# Patient Record
Sex: Male | Born: 1980 | Race: White | Hispanic: No | Marital: Married | Smoking: Never smoker
Health system: Southern US, Community
[De-identification: ages and names within clinical notes are randomized; demographics above are authoritative.]

## PROBLEM LIST (undated history)

## (undated) DIAGNOSIS — E785 Hyperlipidemia, unspecified: Secondary | ICD-10-CM

## (undated) DIAGNOSIS — E118 Type 2 diabetes mellitus with unspecified complications: Secondary | ICD-10-CM

## (undated) DIAGNOSIS — I471 Supraventricular tachycardia, unspecified: Secondary | ICD-10-CM

## (undated) DIAGNOSIS — E119 Type 2 diabetes mellitus without complications: Secondary | ICD-10-CM

## (undated) DIAGNOSIS — J189 Pneumonia, unspecified organism: Secondary | ICD-10-CM

## (undated) DIAGNOSIS — S92351A Displaced fracture of fifth metatarsal bone, right foot, initial encounter for closed fracture: Secondary | ICD-10-CM

## (undated) DIAGNOSIS — T7840XA Allergy, unspecified, initial encounter: Secondary | ICD-10-CM

## (undated) HISTORY — DX: Supraventricular tachycardia, unspecified: I47.10

## (undated) HISTORY — DX: Displaced fracture of fifth metatarsal bone, right foot, initial encounter for closed fracture: S92.351A

## (undated) HISTORY — DX: Pneumonia, unspecified organism: J18.9

## (undated) HISTORY — DX: Type 2 diabetes mellitus with unspecified complications: E11.8

## (undated) HISTORY — DX: Supraventricular tachycardia: I47.1

## (undated) HISTORY — DX: Allergy, unspecified, initial encounter: T78.40XA

## (undated) HISTORY — DX: Type 2 diabetes mellitus without complications: E11.9

## (undated) HISTORY — PX: TONSILECTOMY, ADENOIDECTOMY, BILATERAL MYRINGOTOMY AND TUBES: SHX2538

---

## 2014-04-05 ENCOUNTER — Encounter: Payer: Self-pay | Admitting: Physician Assistant

## 2015-01-07 ENCOUNTER — Emergency Department (HOSPITAL_COMMUNITY)
Admission: EM | Admit: 2015-01-07 | Discharge: 2015-01-07 | Disposition: A | Discharge status: Home / Self Care | Payer: BLUE CROSS/BLUE SHIELD | Attending: Emergency Medicine | Admitting: Emergency Medicine

## 2015-01-07 ENCOUNTER — Encounter (HOSPITAL_COMMUNITY): Payer: Self-pay | Admitting: Emergency Medicine

## 2015-01-07 DIAGNOSIS — R1013 Epigastric pain: Secondary | ICD-10-CM | POA: Diagnosis not present

## 2015-01-07 DIAGNOSIS — R509 Fever, unspecified: Secondary | ICD-10-CM | POA: Insufficient documentation

## 2015-01-07 DIAGNOSIS — E119 Type 2 diabetes mellitus without complications: Secondary | ICD-10-CM | POA: Diagnosis not present

## 2015-01-07 DIAGNOSIS — Z794 Long term (current) use of insulin: Secondary | ICD-10-CM | POA: Diagnosis not present

## 2015-01-07 DIAGNOSIS — Z79899 Other long term (current) drug therapy: Secondary | ICD-10-CM | POA: Diagnosis not present

## 2015-01-07 DIAGNOSIS — R197 Diarrhea, unspecified: Secondary | ICD-10-CM | POA: Insufficient documentation

## 2015-01-07 DIAGNOSIS — Z7951 Long term (current) use of inhaled steroids: Secondary | ICD-10-CM | POA: Insufficient documentation

## 2015-01-07 DIAGNOSIS — E785 Hyperlipidemia, unspecified: Secondary | ICD-10-CM | POA: Diagnosis not present

## 2015-01-07 DIAGNOSIS — R112 Nausea with vomiting, unspecified: Secondary | ICD-10-CM | POA: Insufficient documentation

## 2015-01-07 HISTORY — DX: Hyperlipidemia, unspecified: E78.5

## 2015-01-07 LAB — URINALYSIS, ROUTINE W REFLEX MICROSCOPIC
GLUCOSE, UA: 500 mg/dL — AB
Hgb urine dipstick: NEGATIVE
Ketones, ur: >80 mg/dL — AB
LEUKOCYTES UA: NEGATIVE
NITRITE: NEGATIVE
PH: 5 (ref 5.0–8.0)
Protein, ur: NEGATIVE mg/dL
SPECIFIC GRAVITY, URINE: 1.031 — AB (ref 1.005–1.030)
Urobilinogen, UA: 0.2 mg/dL (ref 0.0–1.0)

## 2015-01-07 LAB — COMPREHENSIVE METABOLIC PANEL
ALT: 20 U/L (ref 17–63)
AST: 23 U/L (ref 15–41)
Albumin: 4.3 g/dL (ref 3.5–5.0)
Alkaline Phosphatase: 55 U/L (ref 38–126)
Anion gap: 8 (ref 5–15)
BUN: 23 mg/dL — AB (ref 6–20)
CHLORIDE: 106 mmol/L (ref 101–111)
CO2: 25 mmol/L (ref 22–32)
Calcium: 9 mg/dL (ref 8.9–10.3)
Creatinine, Ser: 1.01 mg/dL (ref 0.61–1.24)
GFR calc Af Amer: >60 mL/min (ref >60)
Glucose, Bld: 214 mg/dL — ABNORMAL HIGH (ref 65–99)
POTASSIUM: 4.5 mmol/L (ref 3.5–5.1)
SODIUM: 139 mmol/L (ref 135–145)
Total Bilirubin: 1.1 mg/dL (ref 0.3–1.2)
Total Protein: 6.9 g/dL (ref 6.5–8.1)

## 2015-01-07 LAB — CBC
HEMATOCRIT: 61.9 % — AB (ref 39.0–52.0)
Hemoglobin: 17.6 g/dL — ABNORMAL HIGH (ref 13.0–17.0)
MCH: 33.3 pg (ref 26.0–34.0)
MCHC: 28.4 g/dL — ABNORMAL LOW (ref 30.0–36.0)
MCV: 117.2 fL — AB (ref 78.0–100.0)
Platelets: 214 10*3/uL (ref 150–400)
RBC: 5.28 MIL/uL (ref 4.22–5.81)
RDW: 14.7 % (ref 11.5–15.5)
WBC: 14.1 10*3/uL — AB (ref 4.0–10.5)

## 2015-01-07 LAB — LIPASE, BLOOD: LIPASE: 17 U/L — AB (ref 22–51)

## 2015-01-07 LAB — CBG MONITORING, ED
Glucose-Capillary: 185 mg/dL — ABNORMAL HIGH (ref 65–99)
Glucose-Capillary: 161 mg/dL — ABNORMAL HIGH (ref 65–99)

## 2015-01-07 MED ORDER — IBUPROFEN 800 MG PO TABS
800.0000 mg | ORAL_TABLET | Freq: Once | ORAL | Status: AC
  Administered 2015-01-07: 800 mg via ORAL
  Filled 2015-01-07: qty 1

## 2015-01-07 MED ORDER — ONDANSETRON 4 MG PO TBDP
4.0000 mg | ORAL_TABLET | Freq: Three times a day (TID) | ORAL | Status: DC | PRN
Start: 2015-01-07 — End: 2016-05-03

## 2015-01-07 MED ORDER — SODIUM CHLORIDE 0.9 % IV BOLUS (SEPSIS)
1000.0000 mL | Freq: Once | INTRAVENOUS | Status: AC
  Administered 2015-01-07: 1000 mL via INTRAVENOUS

## 2015-01-07 MED ORDER — ONDANSETRON HCL 4 MG/2ML IJ SOLN
4.0000 mg | Freq: Once | INTRAMUSCULAR | Status: AC
  Administered 2015-01-07: 4 mg via INTRAVENOUS
  Filled 2015-01-07: qty 2

## 2015-01-07 MED ORDER — LOPERAMIDE HCL 2 MG PO CAPS
2.0000 mg | ORAL_CAPSULE | Freq: Once | ORAL | Status: AC
  Administered 2015-01-07: 2 mg via ORAL
  Filled 2015-01-07: qty 1

## 2015-01-07 NOTE — Discharge Instructions (Signed)

## 2015-01-07 NOTE — ED Notes (Signed)
Patient with nausea and vomiting since 9pm last night.  Patient is a diabetic and blood sugar at home was 145.  Patient states that he has been having diarrhea with the nausea and vomiting.  Patient states that he has been having some ongoing stomach problems and pain in his abdomen.

## 2015-01-07 NOTE — ED Provider Notes (Signed)
TIME SEEN: 5:10 AM  CHIEF COMPLAINT: Nausea, vomiting and diarrhea  HPI: Pt is a 34 y.o. male with history of insulin-dependent type 2 diabetes, hyperlipidemia, SVT who presents emergency department with nausea, vomiting and diarrhea that started last night. Has a child at home with similar symptoms. Has had fever of 101 and chills. Reports he has had epigastric abdominal pain after 70 episodes of vomiting. No bloody stool or melena. No dysuria or hematuria. No history of abdominal surgery. No recent travel. No antibiotics use or hospitalization.    ROS: See HPI Constitutional: no fever  Eyes: no drainage  ENT: no runny nose   Cardiovascular:  no chest pain  Resp: no SOB  GI:  vomiting GU: no dysuria Integumentary: no rash  Allergy: no hives  Musculoskeletal: no leg swelling  Neurological: no slurred speech ROS otherwise negative  PAST MEDICAL HISTORY/PAST SURGICAL HISTORY:  Past Medical History  Diagnosis Date  . Allergy   . Diabetes mellitus without complication   . Supraventricular tachycardia   . Hyperlipidemia     MEDICATIONS:  Prior to Admission medications   Medication Sig Start Date End Date Taking? Authorizing Provider  atenolol (TENORMIN) 50 MG tablet Take 50 mg by mouth daily.   Yes Historical Provider, MD  atorvastatin (LIPITOR) 40 MG tablet Take 40 mg by mouth daily.   Yes Historical Provider, MD  cetirizine (ZYRTEC) 10 MG tablet Take 10 mg by mouth daily.   Yes Historical Provider, MD  fluticasone (FLONASE) 50 MCG/ACT nasal spray Place into both nostrils daily.   Yes Historical Provider, MD  insulin aspart (NOVOLOG) 100 UNIT/ML injection Inject 6 Units into the skin 3 (three) times daily before meals.   Yes Historical Provider, MD  Insulin Glargine (TOUJEO SOLOSTAR) 300 UNIT/ML SOPN Inject 70 Units into the skin daily.   Yes Historical Provider, MD  metFORMIN (GLUCOPHAGE) 1000 MG tablet Take 1,000 mg by mouth 2 (two) times daily with a meal.   Yes Historical  Provider, MD    ALLERGIES:  No Known Allergies  SOCIAL HISTORY:  Social History  Substance Use Topics  . Smoking status: Never Smoker   . Smokeless tobacco: Never Used  . Alcohol Use: 0.6 oz/week    1 Glasses of wine per week    FAMILY HISTORY: Family History  Problem Relation Age of Onset  . Hyperlipidemia Father   . Arthritis Maternal Aunt   . Diabetes Maternal Aunt   . Hyperlipidemia Maternal Aunt   . Cancer Maternal Grandmother   . Heart disease Maternal Grandmother   . Heart disease Paternal Grandfather     EXAM: BP 129/79 mmHg  Pulse 106  Temp(Src) 97.8 F (36.6 C) (Oral)  Resp 16  SpO2 99% CONSTITUTIONAL: Alert and oriented and responds appropriately to questions. Well-appearing; well-nourished HEAD: Normocephalic EYES: Conjunctivae clear, PERRL ENT: normal nose; no rhinorrhea; slightly dry mucous membranes; pharynx without lesions noted NECK: Supple, no meningismus, no LAD  CARD: Regular and tachycardic; S1 and S2 appreciated; no murmurs, no clicks, no rubs, no gallops RESP: Normal chest excursion without splinting or tachypnea; breath sounds clear and equal bilaterally; no wheezes, no rhonchi, no rales, no hypoxia or respiratory distress, speaking full sentences ABD/GI: Normal bowel sounds; non-distended; soft, non-tender, no rebound, no guarding, no peritoneal signs, negative Murphy sign, no tenderness at McBurney's point BACK:  The back appears normal and is non-tender to palpation, there is no CVA tenderness EXT: Normal ROM in all joints; non-tender to palpation; no edema; normal capillary refill;  no cyanosis, no calf tenderness or swelling    SKIN: Normal color for age and race; warm NEURO: Moves all extremities equally, sensation to light touch intact diffusely, cranial nerves II through XII intact PSYCH: The patient's mood and manner are appropriate. Grooming and personal hygiene are appropriate.  MEDICAL DECISION MAKING: Patient here nausea, vomiting  and diarrhea. Abdominal exam is benign. He is afebrile and nontoxic appearing. Suspect viral gastroenteritis especially given child at home with similar symptoms. I do not think he has cholecystitis, appendicitis. We'll treat symptomatically with Zofran. He is requesting ibuprofen for his pain. Labs show mild leukocytosis and hemoconcentration. Urine shows large ketones but no sign of infection. His blood glucose is mildly elevated at 214 but has a normal bicarbonate and normal anion gap. Suspect patient is dehydrated. We'll give IV fluids and reassess.  ED PROGRESS: Patient reports feeling better after 2 L of IV fluids. His heart rate has improved into the 90s. He is regular without difficulty. No vomiting. Abdominal exam is [place] benign. We'll discharge with prescription for Zofran. Have advised him to use Imodium as needed for diarrhea. Discussed return precautions. I do not feel he needs further emergent workup at this time. Discussed return precautions. He verbalized understanding and discomfort with this plan.     Layla Maw Ward, DO 01/07/15 [id number]

## 2015-09-29 ENCOUNTER — Emergency Department (HOSPITAL_COMMUNITY): Payer: BLUE CROSS/BLUE SHIELD

## 2015-09-29 ENCOUNTER — Emergency Department (HOSPITAL_COMMUNITY)
Admission: EM | Admit: 2015-09-29 | Discharge: 2015-09-29 | Disposition: A | Discharge status: Home / Self Care | Payer: BLUE CROSS/BLUE SHIELD | Attending: Emergency Medicine | Admitting: Emergency Medicine

## 2015-09-29 ENCOUNTER — Encounter (HOSPITAL_COMMUNITY): Payer: Self-pay

## 2015-09-29 DIAGNOSIS — Z794 Long term (current) use of insulin: Secondary | ICD-10-CM | POA: Diagnosis not present

## 2015-09-29 DIAGNOSIS — R000 Tachycardia, unspecified: Secondary | ICD-10-CM | POA: Diagnosis not present

## 2015-09-29 DIAGNOSIS — E785 Hyperlipidemia, unspecified: Secondary | ICD-10-CM | POA: Diagnosis not present

## 2015-09-29 DIAGNOSIS — R112 Nausea with vomiting, unspecified: Secondary | ICD-10-CM | POA: Insufficient documentation

## 2015-09-29 DIAGNOSIS — R1011 Right upper quadrant pain: Secondary | ICD-10-CM | POA: Insufficient documentation

## 2015-09-29 DIAGNOSIS — Z79899 Other long term (current) drug therapy: Secondary | ICD-10-CM | POA: Diagnosis not present

## 2015-09-29 DIAGNOSIS — Z7984 Long term (current) use of oral hypoglycemic drugs: Secondary | ICD-10-CM | POA: Diagnosis not present

## 2015-09-29 DIAGNOSIS — E119 Type 2 diabetes mellitus without complications: Secondary | ICD-10-CM | POA: Insufficient documentation

## 2015-09-29 DIAGNOSIS — R197 Diarrhea, unspecified: Secondary | ICD-10-CM | POA: Diagnosis not present

## 2015-09-29 DIAGNOSIS — R109 Unspecified abdominal pain: Secondary | ICD-10-CM | POA: Diagnosis present

## 2015-09-29 DIAGNOSIS — R1013 Epigastric pain: Secondary | ICD-10-CM | POA: Diagnosis not present

## 2015-09-29 DIAGNOSIS — R1010 Upper abdominal pain, unspecified: Secondary | ICD-10-CM

## 2015-09-29 DIAGNOSIS — Z7951 Long term (current) use of inhaled steroids: Secondary | ICD-10-CM | POA: Diagnosis not present

## 2015-09-29 LAB — COMPREHENSIVE METABOLIC PANEL
ALK PHOS: 61 U/L (ref 38–126)
ALT: 23 U/L (ref 17–63)
ANION GAP: 9 (ref 5–15)
AST: 22 U/L (ref 15–41)
Albumin: 4.1 g/dL (ref 3.5–5.0)
BUN: 20 mg/dL (ref 6–20)
CHLORIDE: 105 mmol/L (ref 101–111)
CO2: 22 mmol/L (ref 22–32)
Calcium: 9 mg/dL (ref 8.9–10.3)
Creatinine, Ser: 0.87 mg/dL (ref 0.61–1.24)
GFR calc non Af Amer: >60 mL/min (ref >60)
Glucose, Bld: 148 mg/dL — ABNORMAL HIGH (ref 65–99)
Potassium: 3.7 mmol/L (ref 3.5–5.1)
SODIUM: 136 mmol/L (ref 135–145)
Total Bilirubin: 1 mg/dL (ref 0.3–1.2)
Total Protein: 6.8 g/dL (ref 6.5–8.1)

## 2015-09-29 LAB — URINALYSIS, ROUTINE W REFLEX MICROSCOPIC
Glucose, UA: 100 mg/dL — AB
Hgb urine dipstick: NEGATIVE
LEUKOCYTES UA: NEGATIVE
NITRITE: NEGATIVE
PH: 5.5 (ref 5.0–8.0)
PROTEIN: NEGATIVE mg/dL
Specific Gravity, Urine: 1.036 — ABNORMAL HIGH (ref 1.005–1.030)

## 2015-09-29 LAB — CBC
HCT: 48.5 % (ref 39.0–52.0)
HEMOGLOBIN: 16.7 g/dL (ref 13.0–17.0)
MCH: 30.7 pg (ref 26.0–34.0)
MCHC: 34.4 g/dL (ref 30.0–36.0)
MCV: 89.2 fL (ref 78.0–100.0)
PLATELETS: 208 10*3/uL (ref 150–400)
RBC: 5.44 MIL/uL (ref 4.22–5.81)
RDW: 12.9 % (ref 11.5–15.5)
WBC: 6.8 10*3/uL (ref 4.0–10.5)

## 2015-09-29 LAB — CBG MONITORING, ED: Glucose-Capillary: 146 mg/dL — ABNORMAL HIGH (ref 65–99)

## 2015-09-29 LAB — LIPASE, BLOOD: LIPASE: 21 U/L (ref 11–51)

## 2015-09-29 MED ORDER — ONDANSETRON 4 MG PO TBDP
4.0000 mg | ORAL_TABLET | Freq: Once | ORAL | Status: AC | PRN
  Administered 2015-09-29: 4 mg via ORAL

## 2015-09-29 MED ORDER — ONDANSETRON 4 MG PO TBDP
ORAL_TABLET | ORAL | Status: AC
  Filled 2015-09-29: qty 1

## 2015-09-29 MED ORDER — DIPHENOXYLATE-ATROPINE 2.5-0.025 MG PO TABS: 2.0000 | ORAL_TABLET | Freq: Four times a day (QID) | ORAL | Status: AC | PRN

## 2015-09-29 MED ORDER — ONDANSETRON HCL 4 MG PO TABS: 4.0000 mg | ORAL_TABLET | Freq: Four times a day (QID) | ORAL | Status: DC

## 2015-09-29 MED ORDER — SODIUM CHLORIDE 0.9 % IV BOLUS (SEPSIS)
1000.0000 mL | Freq: Once | INTRAVENOUS | Status: AC
  Administered 2015-09-29: 1000 mL via INTRAVENOUS

## 2015-09-29 MED ORDER — HYDROMORPHONE HCL 1 MG/ML IJ SOLN
1.0000 mg | Freq: Once | INTRAMUSCULAR | Status: AC
  Administered 2015-09-29: 1 mg via INTRAVENOUS
  Filled 2015-09-29: qty 1

## 2015-09-29 MED ORDER — METOCLOPRAMIDE HCL 5 MG/ML IJ SOLN
10.0000 mg | Freq: Once | INTRAMUSCULAR | Status: AC
  Administered 2015-09-29: 10 mg via INTRAVENOUS
  Filled 2015-09-29: qty 2

## 2015-09-29 NOTE — ED Notes (Signed)
Patient transported to Ultrasound 

## 2015-09-29 NOTE — Discharge Instructions (Signed)

## 2015-09-29 NOTE — ED Notes (Signed)
Pt comes to tech first and states the medication he recv in triage has not helped.

## 2015-09-29 NOTE — ED Notes (Signed)
Pt seen by urgent care this afternoon. Told to return if pain did not resolve/worsened. Pt here complaining of upper right abdominal pain. Pt complaining of nausea, vomiting and diarrhea since yesterday afternoon.

## 2015-09-29 NOTE — ED Provider Notes (Signed)
CSN: [id number]     Arrival date & time 09/29/15  0105 History  By signing my name below, I, Bethel BornBritney McCollum, attest that this documentation has been prepared under the direction and in the presence of Gilda Creasehristopher J Sharona Rovner, MD. Electronically Signed: Bethel BornBritney McCollum, ED Scribe. 09/29/2015. 2:51 AM    Chief Complaint  Patient presents with  . Abdominal Pain  . Nausea  . Emesis  . Diarrhea   The history is provided by the patient. No language interpreter was used.   Javier Mccoy is a 35 y.o. male with PMHx including DM who presents to the Emergency Department complaining of constant, 4/10 in severity, upper abdominal pain with onset yesterday. The pain radiates to the right side of the abdomen. He has had similar pain in the past related to his gallbladder but those episodes were less persistent. He states that typically his gallbladder is affected by his diabetes medication and the pain resolves once he discontinues the medication.  Associated symptoms include nausea , vomiting, and diarrhea.He was seen at Urgent Care for his symptoms last afternoon and advised to return if his symptoms persisted/worsened. The nausea medication that he was given at Urgent Care provided insufficient relief.   Past Medical History  Diagnosis Date  . Allergy   . Diabetes mellitus without complication (HCC)   . Supraventricular tachycardia (HCC)   . Hyperlipidemia    Past Surgical History  Procedure Laterality Date  . Tonsilectomy, adenoidectomy, bilateral myringotomy and tubes     Family History  Problem Relation Age of Onset  . Hyperlipidemia Father   . Arthritis Maternal Aunt   . Diabetes Maternal Aunt   . Hyperlipidemia Maternal Aunt   . Cancer Maternal Grandmother   . Heart disease Maternal Grandmother   . Heart disease Paternal Grandfather    Social History  Substance Use Topics  . Smoking status: Never Smoker   . Smokeless tobacco: Never Used  . Alcohol Use: 0.6 oz/week    1 Glasses of  wine per week    Review of Systems  Gastrointestinal: Positive for nausea, vomiting, abdominal pain and diarrhea.  All other systems reviewed and are negative.  Allergies  Review of patient's allergies indicates no known allergies.  Home Medications   Prior to Admission medications   Medication Sig Start Date End Date Taking? Authorizing Provider  atenolol (TENORMIN) 50 MG tablet Take 50 mg by mouth daily.    Historical Provider, MD  atorvastatin (LIPITOR) 40 MG tablet Take 40 mg by mouth daily.    Historical Provider, MD  cetirizine (ZYRTEC) 10 MG tablet Take 10 mg by mouth daily.    Historical Provider, MD  fluticasone (FLONASE) 50 MCG/ACT nasal spray Place into both nostrils daily.    Historical Provider, MD  insulin aspart (NOVOLOG) 100 UNIT/ML injection Inject 6 Units into the skin 3 (three) times daily before meals.    Historical Provider, MD  Insulin Glargine (TOUJEO SOLOSTAR) 300 UNIT/ML SOPN Inject 70 Units into the skin daily.    Historical Provider, MD  metFORMIN (GLUCOPHAGE) 1000 MG tablet Take 1,000 mg by mouth 2 (two) times daily with a meal.    Historical Provider, MD  ondansetron (ZOFRAN ODT) 4 MG disintegrating tablet Take 1 tablet (4 mg total) by mouth every 8 (eight) hours as needed for nausea or vomiting. 01/07/15   Kristen N Ward, DO   BP 142/92 mmHg  Pulse 120  Temp(Src) 97.4 F (36.3 C) (Oral)  Resp 16  Ht 6' (1.829 m)  Wt 197 lb (89.359 kg)  BMI 26.71 kg/m2  SpO2 97% Physical Exam  Constitutional: He is oriented to person, place, and time. He appears well-developed and well-nourished. No distress.  HENT:  Head: Normocephalic and atraumatic.  Right Ear: Hearing normal.  Left Ear: Hearing normal.  Nose: Nose normal.  Mouth/Throat: Oropharynx is clear and moist and mucous membranes are normal.  Eyes: Conjunctivae and EOM are normal. Pupils are equal, round, and reactive to light.  Neck: Normal range of motion. Neck supple.  Cardiovascular: Regular rhythm,  S1 normal and S2 normal.  Exam reveals no gallop and no friction rub.   No murmur heard. Borderline tachycardia   Pulmonary/Chest: Effort normal and breath sounds normal. No respiratory distress. He exhibits no tenderness.  Abdominal: Soft. Normal appearance and bowel sounds are normal. There is no hepatosplenomegaly. There is no tenderness. There is no rebound, no guarding, no tenderness at McBurney's point and negative Murphy's sign. No hernia.  Epigastric and RUQ TTP No guarding No rebound Negative Murphy's sign   Musculoskeletal: Normal range of motion.  Neurological: He is alert and oriented to person, place, and time. He has normal strength. No cranial nerve deficit or sensory deficit. Coordination normal. GCS eye subscore is 4. GCS verbal subscore is 5. GCS motor subscore is 6.  Skin: Skin is warm, dry and intact. No rash noted. No cyanosis.  Psychiatric: He has a normal mood and affect. His speech is normal and behavior is normal. Thought content normal.  Nursing note and vitals reviewed.   ED Course  Procedures (including critical care time) DIAGNOSTIC STUDIES: Oxygen Saturation is 97% on RA,  normal by my interpretation.    COORDINATION OF CARE: 2:48 AM Discussed treatment plan which includes lab work, RUQ [place], IVF, antiemetic medication, and pain medication with pt at bedside and pt agreed to plan.  Labs Review Labs Reviewed  COMPREHENSIVE METABOLIC PANEL - Abnormal; Notable for the following:    Glucose, Bld 148 (*)    All other components within normal limits  URINALYSIS, ROUTINE W REFLEX MICROSCOPIC (NOT AT [facility]) - Abnormal; Notable for the following:    Color, Urine AMBER (*)    Specific Gravity, Urine 1.036 (*)    Glucose, UA 100 (*)    Bilirubin Urine SMALL (*)    Ketones, ur >80 (*)    All other components within normal limits  CBG MONITORING, ED - Abnormal; Notable for the following:    Glucose-Capillary 146 (*)    All other components within normal limits   LIPASE, BLOOD  CBC    Imaging Review US Abdomen Limited Ruq  09/29/2015  CLINICAL DATA:  Initial evaluation for acute right upper quadrant pain since yesterday, worsening. EXAM: US ABDOMEN LIMITED - RIGHT UPPER QUADRANT COMPARISON:  None. FINDINGS: Gallbladder: No gallstones or wall thickening visualized. No sonographic Murphy sign noted by sonographer. Common bile duct: Diameter: 3 mm Liver: No focal lesion identified. Within normal limits in parenchymal echogenicity. Mild lobulation of the hepatic contour noted. IMPRESSION: Normal right upper quadrant ultrasound. No sonographic evidence for cholelithiasis, acute cholecystitis, or biliary dilatation. Electronically Signed   By: Rise Mu M.D.   On: 09/29/2015 03:41   I have personally reviewed and evaluated these images and lab results as part of my medical decision-making.   EKG Interpretation None      MDM   Final diagnoses:  Pain of upper abdomen  Nausea and vomiting, vomiting of unspecified type  Diarrhea, unspecified type    Patient  refers to the emergency department from urgent care secondary to abdominal pain with nausea, vomiting and diarrhea. Patient reports that he has had similar episodes in the past. He was primarily complaining of pain in the epigastric area but also on the right upper abdomen. Lab work was unremarkable. No sign of urinary tract infection. Glucose was 148, no sign of DKA. CBC and lipase were normal. Patient underwent a bladder ultrasound to further evaluate right upper quadrant pain. No abnormality was seen.Procedure: Intubation Permit was implied secondary to emergent situation. A MAC 4 blade was inserted into the oropharynx at which time the vocal cords were visualized. A 7.5-French endotracheal tube was inserted and visualized going through the vocal cords. The stylette was removed. Colorimetric change was visualized on the CO2 meter. Breath sounds were heard in both lung fields equally. The  endotracheal tube was placed at 22 cm, measured at the teeth. Appear to be mildly dehydrated at arrival. This was treated with IV fluids and symptomatic to him for pain, nausea and vomiting. Patient has improved and will be discharged.  I personally performed the services described in this documentation, which was scribed in my presence. The recorded information has been reviewed and is accurate.    Gilda Crease, MD 09/29/15 [phone]

## 2016-04-27 LAB — BASIC METABOLIC PANEL
BUN: 18 mg/dL (ref 4–21)
CREATININE: 0.9 mg/dL (ref 0.6–1.3)
Glucose: 141 mg/dL
Potassium: 4.7 mmol/L (ref 3.4–5.3)
Sodium: 139 mmol/L (ref 137–147)

## 2016-04-27 LAB — LIPID PANEL
CHOLESTEROL: 158 mg/dL (ref 0–200)
HDL: 42 mg/dL (ref 35–70)
LDL CALC: 81 mg/dL
TRIGLYCERIDES: 175 mg/dL — AB (ref 40–160)

## 2016-04-27 LAB — HEMOGLOBIN A1C: HEMOGLOBIN A1C: 6.8

## 2016-04-27 LAB — HEPATIC FUNCTION PANEL
ALT: 23 U/L (ref 10–40)
AST: 16 U/L (ref 14–40)
Alkaline Phosphatase: 66 U/L (ref 25–125)

## 2016-05-03 ENCOUNTER — Ambulatory Visit (INDEPENDENT_AMBULATORY_CARE_PROVIDER_SITE_OTHER): Payer: BC Managed Care – PPO | Admitting: Family

## 2016-05-03 ENCOUNTER — Encounter: Payer: Self-pay | Admitting: Family

## 2016-05-03 VITALS — BP 110/74 | HR 67 | Temp 98.1°F | Resp 16 | Ht 72.0 in | Wt 207.0 lb

## 2016-05-03 DIAGNOSIS — E785 Hyperlipidemia, unspecified: Secondary | ICD-10-CM | POA: Insufficient documentation

## 2016-05-03 DIAGNOSIS — E782 Mixed hyperlipidemia: Secondary | ICD-10-CM

## 2016-05-03 DIAGNOSIS — Z794 Long term (current) use of insulin: Secondary | ICD-10-CM

## 2016-05-03 DIAGNOSIS — I471 Supraventricular tachycardia, unspecified: Secondary | ICD-10-CM | POA: Insufficient documentation

## 2016-05-03 DIAGNOSIS — E119 Type 2 diabetes mellitus without complications: Secondary | ICD-10-CM | POA: Diagnosis not present

## 2016-05-03 DIAGNOSIS — Z23 Encounter for immunization: Secondary | ICD-10-CM | POA: Diagnosis not present

## 2016-05-03 DIAGNOSIS — E1169 Type 2 diabetes mellitus with other specified complication: Secondary | ICD-10-CM | POA: Insufficient documentation

## 2016-05-03 NOTE — Progress Notes (Signed)
Subjective:    Patient ID: Javier Mccoy, male    DOB: [birth year], 36 y.o.   MRN: [id number]  Chief Complaint  Patient presents with  . Establish Care    Diabetes check up    HPI:  Javier Mccoy is a 36 y.o. male who  has a past medical history of Allergy; Diabetes mellitus without complication (HCC); Hyperlipidemia; and Supraventricular tachycardia (HCC). and presents today for an office visit to establish care.  1.) Type 2 diabetes - currently maintained on NovoLog, metformin, and Toujeo. Reports taking the medication as prescribed and denies adverse side effects and no hypoglycemic readings. Home blood sugars with the highest of 180 and averaging 110-130 . Most recent A1c of 6.8. Last diabetic eye exam completed within the last 12 months and believes it is due in March. No changes in vision or new symptoms of end organ damage.   2.) SVT - currently maintained on atenolol. Reports taking medication as prescribed and notes following exercise he does get some dizziness on occasion when recovering which resolves fairly quickly.  Denies any heart palpitations or episodes of SVT exacerbations.  3.) Hyperlipidemia - currently maintained on atorvastatin. Reports taking the medication as prescribed and denies adverse side effects or myalgias.Has had previous failed treatments with Crestor secondary to abdominal symptoms.    No Known Allergies    Outpatient Medications Prior to Visit  Medication Sig Dispense Refill  . atenolol (TENORMIN) 50 MG tablet Take 50 mg by mouth daily.    Marland Kitchen atorvastatin (LIPITOR) 40 MG tablet Take 40 mg by mouth daily.    . cetirizine (ZYRTEC) 10 MG tablet Take 10 mg by mouth daily.    . diphenoxylate-atropine (LOMOTIL) 2.5-0.025 MG tablet Take 2 tablets by mouth 4 (four) times daily as needed for diarrhea or loose stools. 30 tablet 0  . insulin aspart (NOVOLOG) 100 UNIT/ML injection Inject 6-17 Units into the skin 3 (three) times daily before meals.     .  Insulin Glargine (TOUJEO SOLOSTAR) 300 UNIT/ML SOPN Inject 70 Units into the skin daily.    . metFORMIN (GLUCOPHAGE) 1000 MG tablet Take 1,000 mg by mouth 2 (two) times daily with a meal.    . ondansetron (ZOFRAN) 4 MG tablet Take 1 tablet (4 mg total) by mouth every 6 (six) hours. 20 tablet 0  . fluticasone (FLONASE) 50 MCG/ACT nasal spray Place into both nostrils daily.    . ondansetron (ZOFRAN ODT) 4 MG disintegrating tablet Take 1 tablet (4 mg total) by mouth every 8 (eight) hours as needed for nausea or vomiting. 20 tablet 0   No facility-administered medications prior to visit.      Past Medical History:  Diagnosis Date  . Allergy   . Diabetes mellitus without complication (HCC)   . Hyperlipidemia   . Supraventricular tachycardia [facility])       Past Surgical History:  Procedure Laterality Date  . TONSILECTOMY, ADENOIDECTOMY, BILATERAL MYRINGOTOMY AND TUBES        Family History  Problem Relation Age of Onset  . Thyroid disease Mother   . Hyperlipidemia Father   . Cancer Maternal Grandmother     Breast and lung  . Heart disease Maternal Grandmother   . Healthy Maternal Grandfather   . Healthy Paternal Grandmother   . Heart disease Paternal Grandfather   . Arthritis Maternal Aunt   . Diabetes Maternal Aunt   . Hyperlipidemia Maternal Aunt       Social History   Social History  .  Marital status: Married    Spouse name: N/A  . Number of children: 2  . Years of education: 42   Occupational History  . Administrator     Social History Main Topics  . Smoking status: Never Smoker  . Smokeless tobacco: Never Used  . Alcohol use 0.6 oz/week    1 Glasses of wine per week  . Drug use: No  . Sexual activity: Yes   Other Topics Concern  . Not on file   Social History Narrative   Fun: Cycle, run and kayak.       Review of Systems  Eyes:       Negative for changes in vision.  Respiratory: Negative for chest tightness and shortness of breath.     Cardiovascular: Negative for chest pain, palpitations and leg swelling.  Endocrine: Negative for polydipsia, polyphagia and polyuria.  Neurological: Negative for dizziness, weakness, light-headedness and headaches.       Objective:    BP 110/74 (BP Location: Left Arm, Patient Position: Sitting, Cuff Size: Large)   Pulse 67   Temp 98.1 F (36.7 C) (Oral)   Resp 16   Ht 6' (1.829 m)   Wt 207 lb (93.9 kg)   SpO2 97%   BMI 28.07 kg/m  Nursing note and vital signs reviewed.  Physical Exam  Constitutional: He is oriented to person, place, and time. He appears well-developed and well-nourished. No distress.  Cardiovascular: Normal rate, regular rhythm, normal heart sounds and intact distal pulses.   Pulmonary/Chest: Effort normal and breath sounds normal.  Neurological: He is alert and oriented to person, place, and time.  Diabetic foot exam - bilateral feet are free from skin breakdown, cuts, and abrasions. Toenails are neatly trimmed. Pulses are intact and appropriate. Sensation is intact to monofilament bilaterally.  Skin: Skin is warm and dry.  Psychiatric: He has a normal mood and affect. His behavior is normal. Judgment and thought content normal.        Assessment & Plan:   Problem List Items Addressed This Visit      Cardiovascular and Mediastinum   SVT (supraventricular tachycardia) (HCC)    Supraventricular tachycardia stable and maintained on atenolol with no episodes recently. Denies chest pain, shortness of breath, or heart palpitations with normal cardiac exam today. Continue current dosage of atenolol. Continue to monitor.        Endocrine   Type 2 diabetes mellitus (HCC) - Primary    Most recent A1c appears stable at 6.8 with current regimen and no adverse side effects or hypoglycemic readings. Pneumovax updated today. Diabetic foot exam completed. Diabetic eye exam is up-to-date. Maintained on atorvastatin for CAD risk reduction. Continue current dosage of  metformin, NovoLog, and Toujeo. May require switch from long-acting insulin secondary to insurance coverage. Has had previous treatment failures with GLP-1 agonist secondary to adverse side effects. Continue to monitor.        Other   Mixed hyperlipidemia    Mixed hyperlipidemia currently maintained on atorvastatin with no adverse side effects or myalgias. Most recent lipid profile appears stable. Continue current dosage of atorvastatin and continue to monitor.       Other Visit Diagnoses    Need for 23-polyvalent pneumococcal polysaccharide vaccine       Relevant Orders   Pneumococcal polysaccharide vaccine 23-valent greater than or equal to 2yo subcutaneous/IM (Completed)       I have discontinued Mr. Kerney fluticasone. I am also having him maintain his cetirizine, metFORMIN, atenolol, atorvastatin, Insulin  Glargine, insulin aspart, ondansetron, and diphenoxylate-atropine.   Follow-up: Return in about 3 months (around 08/01/2016), or if symptoms worsen or fail to improve.   Jeanine Luzalone, Gregory, FNP

## 2016-05-03 NOTE — Assessment & Plan Note (Signed)
Most recent A1c appears stable at 6.8 with current regimen and no adverse side effects or hypoglycemic readings. Pneumovax updated today. Diabetic foot exam completed. Diabetic eye exam is up-to-date. Maintained on atorvastatin for CAD risk reduction. Continue current dosage of metformin, NovoLog, and Toujeo. May require switch from long-acting insulin secondary to insurance coverage. Has had previous treatment failures with GLP-1 agonist secondary to adverse side effects. Continue to monitor.

## 2016-05-03 NOTE — Assessment & Plan Note (Signed)
Supraventricular tachycardia stable and maintained on atenolol with no episodes recently. Denies chest pain, shortness of breath, or heart palpitations with normal cardiac exam today. Continue current dosage of atenolol. Continue to monitor.

## 2016-05-03 NOTE — Assessment & Plan Note (Signed)
Mixed hyperlipidemia currently maintained on atorvastatin with no adverse side effects or myalgias. Most recent lipid profile appears stable. Continue current dosage of atorvastatin and continue to monitor.

## 2016-05-03 NOTE — Patient Instructions (Addendum)
Thank you for choosing [facility].  SUMMARY AND INSTRUCTIONS:  Medication:  Please continue to take your medications as prescribed.   Your prescription(s) have been submitted to your pharmacy or been printed and provided for you. Please take as directed and contact our office if you believe you are having problem(s) with the medication(s) or have any questions.  Follow up:  If your symptoms worsen or fail to improve, please contact our office for further instruction, or in case of emergency go directly to the emergency room at the closest medical facility.     

## 2016-05-30 ENCOUNTER — Encounter: Payer: Self-pay | Admitting: Family

## 2016-05-31 ENCOUNTER — Other Ambulatory Visit: Payer: Self-pay

## 2016-05-31 MED ORDER — ONETOUCH ULTRA SYSTEM W/DEVICE KIT: 1.0000 | PACK | Freq: Once | 0 refills | Status: AC

## 2016-06-23 ENCOUNTER — Other Ambulatory Visit: Payer: Self-pay

## 2016-06-23 MED ORDER — ATORVASTATIN CALCIUM 40 MG PO TABS: 40.0000 mg | ORAL_TABLET | Freq: Every day | ORAL | 1 refills | Status: DC

## 2016-06-23 MED ORDER — GLUCOSE BLOOD VI STRP: ORAL_STRIP | 12 refills | Status: DC

## 2016-07-07 ENCOUNTER — Encounter: Payer: Self-pay | Admitting: Physician Assistant

## 2016-07-19 ENCOUNTER — Encounter: Payer: Self-pay | Admitting: Family

## 2016-07-19 ENCOUNTER — Other Ambulatory Visit: Payer: Self-pay

## 2016-07-19 MED ORDER — PEN NEEDLES 31G X 5 MM MISC: 1.0000 "pen " | Freq: Three times a day (TID) | 3 refills | Status: DC

## 2016-08-05 ENCOUNTER — Telehealth: Payer: Self-pay | Admitting: Physician Assistant

## 2016-08-05 NOTE — Telephone Encounter (Signed)
Ok with me 

## 2016-08-05 NOTE — Telephone Encounter (Signed)
Ok by me if ok by current PCP 

## 2016-08-05 NOTE — Telephone Encounter (Signed)
Pt called wanted to switch from dr Carver Fila to dr g.  He stated he is closer to [place] location.  Is it ok to schedule??

## 2016-08-09 NOTE — Telephone Encounter (Signed)
Appointment 6/6 Pt aware

## 2016-09-14 ENCOUNTER — Encounter: Payer: Self-pay | Admitting: Family

## 2016-09-15 ENCOUNTER — Other Ambulatory Visit: Payer: Self-pay

## 2016-09-15 MED ORDER — ATENOLOL 50 MG PO TABS: 50.0000 mg | ORAL_TABLET | Freq: Every day | ORAL | 0 refills | Status: DC

## 2016-09-29 ENCOUNTER — Encounter: Payer: Self-pay | Admitting: Family Medicine

## 2016-09-29 ENCOUNTER — Ambulatory Visit (INDEPENDENT_AMBULATORY_CARE_PROVIDER_SITE_OTHER): Payer: BC Managed Care – PPO | Admitting: Family Medicine

## 2016-09-29 VITALS — BP 114/70 | HR 67 | Temp 97.4°F | Ht 72.0 in | Wt 205.8 lb

## 2016-09-29 DIAGNOSIS — Z794 Long term (current) use of insulin: Secondary | ICD-10-CM | POA: Diagnosis not present

## 2016-09-29 DIAGNOSIS — E782 Mixed hyperlipidemia: Secondary | ICD-10-CM

## 2016-09-29 DIAGNOSIS — M6788 Other specified disorders of synovium and tendon, other site: Secondary | ICD-10-CM | POA: Insufficient documentation

## 2016-09-29 DIAGNOSIS — M67972 Unspecified disorder of synovium and tendon, left ankle and foot: Secondary | ICD-10-CM

## 2016-09-29 DIAGNOSIS — I471 Supraventricular tachycardia: Secondary | ICD-10-CM | POA: Diagnosis not present

## 2016-09-29 DIAGNOSIS — E119 Type 2 diabetes mellitus without complications: Secondary | ICD-10-CM | POA: Diagnosis not present

## 2016-09-29 LAB — BASIC METABOLIC PANEL
BUN: 20 mg/dL (ref 6–23)
CHLORIDE: 105 meq/L (ref 96–112)
CO2: 26 meq/L (ref 19–32)
CREATININE: 0.85 mg/dL (ref 0.40–1.50)
Calcium: 9.3 mg/dL (ref 8.4–10.5)
GFR: 107.99 mL/min (ref >60.00)
Glucose, Bld: 112 mg/dL — ABNORMAL HIGH (ref 70–99)
POTASSIUM: 4.1 meq/L (ref 3.5–5.1)
Sodium: 138 mEq/L (ref 135–145)

## 2016-09-29 LAB — MICROALBUMIN / CREATININE URINE RATIO
Creatinine,U: 230.9 mg/dL
MICROALB UR: 1.4 mg/dL (ref 0.0–1.9)
MICROALB/CREAT RATIO: 0.6 mg/g (ref 0.0–30.0)

## 2016-09-29 LAB — HEMOGLOBIN A1C: HEMOGLOBIN A1C: 6.5 % (ref 4.6–6.5)

## 2016-09-29 NOTE — Patient Instructions (Addendum)
Labs today Schedule eye exam as you're due. Schedule follow up with Dr Patsy Lager[place] for evaluation of left achilles tendon.  Let me know when running low on toujeo (to switch to levemir).  Return in 6 months for physical.

## 2016-09-29 NOTE — Assessment & Plan Note (Signed)
Chronic, stable. Continue current regimen. Pt interested in switch back to levemir (better insurance coverage). Pt will let us know when running low on current toujeo.  Update labs today.  rec schedule eye exam.

## 2016-09-29 NOTE — Progress Notes (Signed)
BP 114/70   Pulse 67   Temp 97.4 F (36.3 C) (Oral)   Ht 6' (1.829 m)   Wt 205 lb 12.8 oz (93.4 kg)   SpO2 96%   BMI 27.91 kg/m    CC: transfer care visit Subjective:    Patient ID: Javier Mccoy, male    DOB: 1980/11/18, 36 y.o.   MRN: [id number]  HPI: Javier Mccoy is a 36 y.o. male presenting on 09/29/2016 for Establish Care   Longstanding chronic achilles tendonitis. Prior injury 2.5 yrs ago.   SVT since 2003 - controlled with atenolol 50mg  daily  HLD - compliant with atorvastatin 40mg  daily without myalgias. Endorses occasional L achilles inflammation.   DM - dx 2008. Regularly does check sugars BID or more, they tend to run <200. Compliant with antihyperglycemic regimen which includes: novolog mealtime 6-17 units (6u with meals + 3-4 units every 30 above 150), toujeo 70u nightly, metformin 1000mg  bid. Has gone through CGM. (levemir 40u -> toujeo 70u changed for better glycemic control, interested in return to levemir). Few low sugars, with hypoglycemia. Denies paresthesias. Last diabetic eye exam DUE. Pneumovax: 04/2016. Prevnar: not due. Foot exam done 04/2016. Previous treatment failure with GLP-1 agonist due to adverse side effect.  Lab Results  Component Value Date   HGBA1C 6.8 04/27/2016   Diabetic Foot Exam - Simple   Simple Foot Form Diabetic Foot exam was performed with the following findings:  Yes 09/29/2016  8:15 AM  Visual Inspection No deformities, no ulcerations, no other skin breakdown bilaterally:  Yes Sensation Testing Intact to touch and monofilament testing bilaterally:  Yes Pulse Check Posterior Tibialis and Dorsalis pulse intact bilaterally:  Yes Comments      Lives with husband [name]) and 2 children Occ: college Automotive engineer Activity: cycle, run and kayak.  Diet: good water, fruits/vegetables daily  Relevant past medical, surgical, family and social history reviewed and updated as indicated. Interim medical history since our last  visit reviewed. Allergies and medications reviewed and updated. Outpatient Medications Prior to Visit  Medication Sig Dispense Refill  . atenolol (TENORMIN) 50 MG tablet Take 1 tablet (50 mg total) by mouth daily. 90 tablet 0  . atorvastatin (LIPITOR) 40 MG tablet Take 1 tablet (40 mg total) by mouth daily. 90 tablet 1  . cetirizine (ZYRTEC) 10 MG tablet Take 10 mg by mouth daily.    . diphenoxylate-atropine (LOMOTIL) 2.5-0.025 MG tablet Take 2 tablets by mouth 4 (four) times daily as needed for diarrhea or loose stools. 30 tablet 0  . glucose blood test strip Use as instructed 100 each 12  . insulin aspart (NOVOLOG) 100 UNIT/ML injection Inject 6-17 Units into the skin 3 (three) times daily before meals.     . Insulin Glargine (TOUJEO SOLOSTAR) 300 UNIT/ML SOPN Inject 70 Units into the skin daily.    . Insulin Pen Needle (PEN NEEDLES) 31G X 5 MM MISC Inject 1 pen into the skin 3 (three) times daily. 100 each 3  . metFORMIN (GLUCOPHAGE) 1000 MG tablet Take 1,000 mg by mouth 2 (two) times daily with a meal.    . ondansetron (ZOFRAN) 4 MG tablet Take 1 tablet (4 mg total) by mouth every 6 (six) hours. 20 tablet 0   No facility-administered medications prior to visit.      Per HPI unless specifically indicated in ROS section below Review of Systems     Objective:    BP 114/70   Pulse 67   Temp 97.4  F (36.3 C) (Oral)   Ht 6' (1.829 m)   Wt 205 lb 12.8 oz (93.4 kg)   SpO2 96%   BMI 27.91 kg/m   Wt Readings from Last 3 Encounters:  09/29/16 205 lb 12.8 oz (93.4 kg)  05/03/16 207 lb (93.9 kg)  09/29/15 197 lb (89.4 kg)    Physical Exam  Constitutional: He appears well-developed and well-nourished. No distress.  HENT:  Head: Normocephalic and atraumatic.  Right Ear: External ear normal.  Left Ear: External ear normal.  Nose: Nose normal.  Mouth/Throat: Oropharynx is clear and moist. No oropharyngeal exudate.  Eyes: Conjunctivae and EOM are normal. Pupils are equal, round, and  reactive to light. No scleral icterus.  Neck: Normal range of motion. Neck supple. No thyromegaly present.  Cardiovascular: Normal rate, regular rhythm, normal heart sounds and intact distal pulses.   No murmur heard. Pulmonary/Chest: Effort normal and breath sounds normal. No respiratory distress. He has no wheezes. He has no rales.  Musculoskeletal: He exhibits no edema.  Left heel with chronic swelling at achilles tendon without marked discomfort High arches bilaterally See HPI for foot exam if done  Lymphadenopathy:    He has no cervical adenopathy.  Skin: Skin is warm and dry. No rash noted.  Psychiatric: He has a normal mood and affect.  Nursing note and vitals reviewed.  Results for orders placed or performed in visit on 07/07/16  Basic metabolic panel  Result Value Ref Range   Glucose 141 mg/dL   BUN 18 4 - 21 mg/dL   Creatinine 0.9 0.6 - 1.3 mg/dL   Potassium 4.7 3.4 - 5.3 mmol/L   Sodium 139 137 - 147 mmol/L  Lipid panel  Result Value Ref Range   Triglycerides 175 (A) 40 - 160 mg/dL   Cholesterol 147158 0 - 829200 mg/dL   HDL 42 35 - 70 mg/dL   LDL Cholesterol 81 mg/dL  Hepatic function panel  Result Value Ref Range   Alkaline Phosphatase 66 25 - 125 U/L   ALT 23 10 - 40 U/L   AST 16 14 - 40 U/L  Hemoglobin A1c  Result Value Ref Range   Hemoglobin A1C 6.8       Assessment & Plan:   Problem List Items Addressed This Visit    Disorder of left Achilles tendon    Anticipate chronic achilles tendonitis along with high arches. Pt would like further evaluation of this - suggested f/u with sports medicine for eval.       Mixed hyperlipidemia    Chronic, stable on atorvastatin 40mg  daily.       SVT (supraventricular tachycardia) (HCC)    Chronic, stable on atenolol 50mg  daily.       Type 2 diabetes mellitus (HCC) - Primary    Chronic, stable. Continue current regimen. Pt interested in switch back to levemir (better insurance coverage). Pt will let us know when  running low on current toujeo.  Update labs today.  rec schedule eye exam.       Relevant Orders   Basic metabolic panel   Hemoglobin A1c   Microalbumin / creatinine urine ratio       Follow up plan: Return in about 6 months (around 03/31/2017) for annual exam, prior fasting for blood work.  Eustaquio BoydenJavier Eryanna Regal, MD

## 2016-09-29 NOTE — Assessment & Plan Note (Signed)
Chronic, stable on atorvastatin 40mg  daily.

## 2016-09-29 NOTE — Assessment & Plan Note (Signed)
Chronic, stable on atenolol 50mg daily. 

## 2016-09-29 NOTE — Assessment & Plan Note (Signed)
Anticipate chronic achilles tendonitis along with high arches. Pt would like further evaluation of this - suggested f/u with sports medicine for eval.

## 2016-10-11 ENCOUNTER — Encounter: Payer: Self-pay | Admitting: Family Medicine

## 2016-10-11 ENCOUNTER — Ambulatory Visit (INDEPENDENT_AMBULATORY_CARE_PROVIDER_SITE_OTHER): Payer: BC Managed Care – PPO | Admitting: Family Medicine

## 2016-10-11 VITALS — BP 110/74 | HR 74 | Temp 98.2°F | Ht 72.0 in | Wt 209.0 lb

## 2016-10-11 DIAGNOSIS — M7662 Achilles tendinitis, left leg: Secondary | ICD-10-CM | POA: Diagnosis not present

## 2016-10-11 DIAGNOSIS — M6788 Other specified disorders of synovium and tendon, other site: Secondary | ICD-10-CM

## 2016-10-11 MED ORDER — NITROGLYCERIN 0.2 MG/HR TD PT24: MEDICATED_PATCH | TRANSDERMAL | 4 refills | Status: DC

## 2016-10-11 NOTE — Progress Notes (Signed)
Dr. Karleen Hampshire T. Emmelyn Schmale, MD, CAQ Sports Medicine Primary Care and Sports Medicine [address] [place], [address] Phone: [phone] Fax: [phone]  10/11/2016  Patient: Javier Mccoy, MRN: [id number], DOB: 1981/01/14, 36 y.o.  Primary Physician:  Eustaquio Boyden, MD   Chief Complaint  Patient presents with  . Tendonitis    Left   Subjective:   Pleasant patient who presents with a 2 1/2 year history of L posterior heel pain.  No occult, abrupt onset. Has been more insidious in character. There is a dull ache present and worse with activity:  L running on the beach and ran.  Points to proximal tendon Rest, ice. Did do sme formal PT, then on vacation and on a lot of vacation. Shoes. bothering  Prior home rehab: yes and home PT, no eccentrics Prior meds: NSAIDS and Tylenol Orthosis / Braces: none  He did see SM doc [facility] who suggested PRP, which he elected not to do.  The PMH, PSH, Social History, Family History, Medications, and allergies have been reviewed in [facility], and have been updated if relevant.  Patient Active Problem List   Diagnosis Date Noted  . Disorder of left Achilles tendon 09/29/2016  . Type 2 diabetes mellitus (HCC) 05/03/2016  . SVT (supraventricular tachycardia) (HCC) 05/03/2016  . Mixed hyperlipidemia 05/03/2016    Past Medical History:  Diagnosis Date  . Allergy   . Diabetes mellitus without complication (HCC)   . Hyperlipidemia   . Supraventricular tachycardia [facility])     Past Surgical History:  Procedure Laterality Date  . TONSILECTOMY, ADENOIDECTOMY, BILATERAL MYRINGOTOMY AND TUBES      Social History   Social History  . Marital status: Married    Spouse name: N/A  . Number of children: 2  . Years of education: 62   Occupational History  . Administrator     Social History Main Topics  . Smoking status: Never Smoker  . Smokeless tobacco: Never Used  . Alcohol use 0.6 oz/week    1 Glasses of wine per week  . Drug  use: No  . Sexual activity: Yes   Other Topics Concern  . Not on file   Social History Narrative   Lives with husband [name]) and 2 children   Occ: college administrator [employer]   Activity: cycle, run and kayak.    Diet: good water, fruits/vegetables daily    Family History  Problem Relation Age of Onset  . Thyroid disease Mother   . Hyperlipidemia Father   . Cancer Maternal Grandmother        Breast and lung  . Heart disease Maternal Grandmother   . Healthy Maternal Grandfather   . Healthy Paternal Grandmother   . Heart disease Paternal Grandfather   . Arthritis Maternal Aunt   . Diabetes Maternal Aunt   . Hyperlipidemia Maternal Aunt     No Known Allergies  Medication list reviewed and updated in full in [place] [facility].  GEN: No fevers, chills. Nontoxic. Primarily MSK c/o today. MSK: Detailed in the HPI GI: tolerating PO intake without difficulty Neuro: No numbness, parasthesias, or tingling associated. Otherwise the pertinent positives of the ROS are noted above.   Objective:   Blood pressure 110/74, pulse 74, temperature 98.2 F (36.8 C), temperature source Oral, height 6' (1.829 m), weight 209 lb (94.8 kg).   GEN: Well-developed,well-nourished,in no acute distress; alert,appropriate and cooperative throughout examination HEENT: Normocephalic and atraumatic without obvious abnormalities. Ears, externally no deformities PULM: Breathing comfortably  in no respiratory distress EXT: No clubbing, cyanosis, or edema PSYCH: Normally interactive. Cooperative during the interview. Pleasant. Friendly and conversant. Not anxious or depressed appearing. Normal, full affect.  Foot: L Echymosis: no Edema: no ROM: full LE B Gait: heel toe, non-antalgic MT pain: no Callus pattern: none Lateral Mall: NT Medial Mall: NT Talus: NT Navicular: NT Cuboid: NT Calcaneous: NT Metatarsals: NT 5th MT: NT Phalanges: NT Achilles: PAINFUL TO PALPATE AT INSERTION ON LEFT, SMALL  NODULE Plantar Fascia: NT Fat Pad: NT Peroneals: NT Post Tib: NT Great Toe: Nml motion Ant Drawer: neg ATFL: NT CFL: NT Deltoid: NT Sensation: intact  Radiology: No results found.  Assessment and Plan:   Achilles tendinosis of left lower extremity  Pathophysiology of achilles tendinopathy reviewed.  Additionally, I have given the patient the program emphasizing eccentric overloading detailed in the instructions based on Dr. Renato Gails work and protocols.  Supportive footwear reviewed.  I made a poron heel lift for his shoe.  NTG Patches + eccentrics  Follow-up: Return in about 2 months (around 12/11/2016).  New Prescriptions   NITROGLYCERIN (NITRODUR - DOSED IN MG/24 HR) 0.2 MG/HR PATCH    Apply 1/4 of a patch to the affected area and change every 24 hours (re: tendinopathy)   Patient Instructions  Achilles Rehab  Begin with easy walking, heel, toe and backwards  Calf raises on a step First lower and then raise on 1 foot If this is painful lower on 1 foot but do the heel raise on both feet  Begin with 3 sets of 10 repetitions  Increase by 5 repetitions every 3 days  Goal is 3 sets of 30 repetitions  Do with both knee straight and knee at 20 degrees of flexion  If pain persists at 3 sets of 30 - add backpack with 5 lbs Increase by 5 lbs per week to max of 30 lbs    Signed,  Lonza Shimabukuro T. Woodson Macha, MD   Patient's Medications  New Prescriptions   NITROGLYCERIN (NITRODUR - DOSED IN MG/24 HR) 0.2 MG/HR PATCH    Apply 1/4 of a patch to the affected area and change every 24 hours (re: tendinopathy)  Previous Medications   ATENOLOL (TENORMIN) 50 MG TABLET    Take 1 tablet (50 mg total) by mouth daily.   ATORVASTATIN (LIPITOR) 40 MG TABLET    Take 1 tablet (40 mg total) by mouth daily.   CETIRIZINE (ZYRTEC) 10 MG TABLET    Take 10 mg by mouth daily.   DIPHENOXYLATE-ATROPINE (LOMOTIL) 2.5-0.025 MG TABLET    Take 2 tablets by mouth 4 (four) times daily as needed for  diarrhea or loose stools.   GLUCOSE BLOOD TEST STRIP    Use as instructed   IBUPROFEN (ADVIL,MOTRIN) 800 MG TABLET    Take 800 mg by mouth every 8 (eight) hours as needed.   INSULIN ASPART (NOVOLOG) 100 UNIT/ML INJECTION    Inject 6-17 Units into the skin 3 (three) times daily before meals.    INSULIN GLARGINE (TOUJEO SOLOSTAR) 300 UNIT/ML SOPN    Inject 70 Units into the skin daily.   INSULIN PEN NEEDLE (PEN NEEDLES) 31G X 5 MM MISC    Inject 1 pen into the skin 3 (three) times daily.   METFORMIN (GLUCOPHAGE) 1000 MG TABLET    Take 1,000 mg by mouth 2 (two) times daily with a meal.   ONDANSETRON (ZOFRAN) 4 MG TABLET    Take 1 tablet (4 mg total) by mouth every 6 (six)  hours.  Modified Medications   No medications on file  Discontinued Medications   No medications on file

## 2016-10-11 NOTE — Patient Instructions (Signed)
Achilles Rehab  Begin with easy walking, heel, toe and backwards  Calf raises on a step First lower and then raise on 1 foot If this is painful lower on 1 foot but do the heel raise on both feet  Begin with 3 sets of 10 repetitions  Increase by 5 repetitions every 3 days  Goal is 3 sets of 30 repetitions  Do with both knee straight and knee at 20 degrees of flexion  If pain persists at 3 sets of 30 - add backpack with 5 lbs Increase by 5 lbs per week to max of 30 lbs  

## 2016-10-14 ENCOUNTER — Encounter: Payer: Self-pay | Admitting: Family Medicine

## 2016-10-14 MED ORDER — METFORMIN HCL 1000 MG PO TABS: 1000.0000 mg | ORAL_TABLET | Freq: Two times a day (BID) | ORAL | 3 refills | Status: DC

## 2016-12-02 ENCOUNTER — Encounter: Payer: Self-pay | Admitting: Family Medicine

## 2016-12-02 MED ORDER — INSULIN DETEMIR 100 UNIT/ML FLEXPEN: 50.0000 [IU] | PEN_INJECTOR | Freq: Every day | SUBCUTANEOUS | 3 refills | Status: DC

## 2016-12-02 NOTE — Telephone Encounter (Signed)
Please advise if okay to change, in last OV note it looks like pt mentions switching back due to the cost

## 2016-12-11 ENCOUNTER — Other Ambulatory Visit: Payer: Self-pay | Admitting: Family

## 2016-12-13 ENCOUNTER — Ambulatory Visit (INDEPENDENT_AMBULATORY_CARE_PROVIDER_SITE_OTHER): Payer: BC Managed Care – PPO | Admitting: Family Medicine

## 2016-12-13 ENCOUNTER — Encounter: Payer: Self-pay | Admitting: Family Medicine

## 2016-12-13 VITALS — BP 94/70 | HR 67 | Temp 98.6°F | Ht 72.0 in | Wt 210.8 lb

## 2016-12-13 DIAGNOSIS — M7662 Achilles tendinitis, left leg: Secondary | ICD-10-CM | POA: Diagnosis not present

## 2016-12-13 DIAGNOSIS — M6788 Other specified disorders of synovium and tendon, other site: Secondary | ICD-10-CM

## 2016-12-13 NOTE — Progress Notes (Signed)
Dr. Karleen Hampshire T. Barbra Miner, MD, CAQ Sports Medicine Primary Care and Sports Medicine [address] [place], [address] Phone: [phone] Fax: [phone]  12/13/2016  Patient: Javier Mccoy, MRN: [id number], DOB: 01-Jun-1980, 36 y.o.  Primary Physician:  Eustaquio Boyden, MD   Chief Complaint  Patient presents with  . Follow-up    Left Achilles Tendinosis   Subjective:   Javier Mccoy is a 36 y.o. very pleasant male patient who presents with the following:  F/u L AT: He has had left-sided Achilles tendinopathy for approximately 3 years. We placed him on nitroglycerin protocol, placed him in some heel cups, and additionally had him do some eccentric exercises, and he has done really well. He has decreased and his pain significantly, and he is able to run 2 and a half miles at one stretch currently. He is doing this several times a week.  Mostly better  Past Medical History, Surgical History, Social History, Family History, Problem List, Medications, and Allergies have been reviewed and updated if relevant.  Patient Active Problem List   Diagnosis Date Noted  . Achilles tendinosis of left lower extremity 09/29/2016  . Type 2 diabetes mellitus (HCC) 05/03/2016  . SVT (supraventricular tachycardia) (HCC) 05/03/2016  . Mixed hyperlipidemia 05/03/2016    Past Medical History:  Diagnosis Date  . Allergy   . Diabetes mellitus without complication (HCC)   . Hyperlipidemia   . Supraventricular tachycardia [facility])     Past Surgical History:  Procedure Laterality Date  . TONSILECTOMY, ADENOIDECTOMY, BILATERAL MYRINGOTOMY AND TUBES      Social History   Social History  . Marital status: Married    Spouse name: N/A  . Number of children: 2  . Years of education: 51   Occupational History  . Administrator     Social History Main Topics  . Smoking status: Never Smoker  . Smokeless tobacco: Never Used  . Alcohol use 0.6 oz/week    1 Glasses of wine per week  .  Drug use: No  . Sexual activity: Yes   Other Topics Concern  . Not on file   Social History Narrative   Lives with husband [name]) and 2 children   Occ: college administrator [employer]   Activity: cycle, run and kayak.    Diet: good water, fruits/vegetables daily    Family History  Problem Relation Age of Onset  . Thyroid disease Mother   . Hyperlipidemia Father   . Cancer Maternal Grandmother        Breast and lung  . Heart disease Maternal Grandmother   . Healthy Maternal Grandfather   . Healthy Paternal Grandmother   . Heart disease Paternal Grandfather   . Arthritis Maternal Aunt   . Diabetes Maternal Aunt   . Hyperlipidemia Maternal Aunt     No Known Allergies  Medication list reviewed and updated in full in [place] [facility].  GEN: No fevers, chills. Nontoxic. Primarily MSK c/o today. MSK: Detailed in the HPI GI: tolerating PO intake without difficulty Neuro: No numbness, parasthesias, or tingling associated. Otherwise the pertinent positives of the ROS are noted above.   Objective:   BP 94/70   Pulse 67   Temp 98.6 F (37 C) (Oral)   Ht 6' (1.829 m)   Wt 210 lb 12 oz (95.6 kg)   BMI 28.58 kg/m    GEN: Well-developed,well-nourished,in no acute distress; alert,appropriate and cooperative throughout examination HEENT: Normocephalic and atraumatic without obvious abnormalities. Ears, externally no deformities PULM:  Breathing comfortably in no respiratory distress EXT: No clubbing, cyanosis, or edema PSYCH: Normally interactive. Cooperative during the interview. Pleasant. Friendly and conversant. Not anxious or depressed appearing. Normal, full affect.  Foot: L Echymosis: no Edema: no ROM: full LE B Gait: heel toe, non-antalgic MT pain: no Callus pattern: none Lateral Mall: NT Medial Mall: NT Talus: NT Navicular: NT Cuboid: NT Calcaneous: NT Metatarsals: NT 5th MT: NT Phalanges: NT Achilles: Minimally PAINFUL TO PALPATE AT INSERTION ON LEFT, SMALL  NODULE Plantar Fascia: NT Fat Pad: NT Peroneals: NT Post Tib: NT Great Toe: Nml motion Ant Drawer: neg ATFL: NT CFL: NT Deltoid: NT Sensation: intact   Radiology: No results found.  Assessment and Plan:   Achilles tendinosis of left lower extremity   Cont nitrodur with 1-2 time a week eccentrics  Titrate up running slowly - has a goal of 10 k  Follow-up: prn only  Future Appointments Date Time Provider Department Center  03/24/2017 7:30 AM [facility]  03/31/2017 4:00 PM Eustaquio Boyden, MD [facility]   Medications Discontinued During This Encounter  Medication Reason  . Insulin Glargine (TOUJEO SOLOSTAR) 300 UNIT/ML SOPN Change in therapy    Signed,  Herschel Fleagle T. Keanan Melander, MD   Allergies as of 12/13/2016   No Known Allergies     Medication List       Accurate as of 12/13/16  2:07 PM. Always use your most recent med list.          atenolol 50 MG tablet Commonly known as:  TENORMIN TAKE 1 TABLET BY MOUTH EVERY DAY   atorvastatin 40 MG tablet Commonly known as:  LIPITOR Take 1 tablet (40 mg total) by mouth daily.   cetirizine 10 MG tablet Commonly known as:  ZYRTEC Take 10 mg by mouth daily.   diphenoxylate-atropine 2.5-0.025 MG tablet Commonly known as:  LOMOTIL Take 2 tablets by mouth 4 (four) times daily as needed for diarrhea or loose stools.   glucose blood test strip Use as instructed   ibuprofen 800 MG tablet Commonly known as:  ADVIL,MOTRIN Take 800 mg by mouth every 8 (eight) hours as needed.   insulin aspart 100 UNIT/ML injection Commonly known as:  novoLOG Inject 6-17 Units into the skin 3 (three) times daily before meals.   Insulin Detemir 100 UNIT/ML Pen Commonly known as:  LEVEMIR FLEXPEN Inject 50 Units into the skin daily at 10 pm.   metFORMIN 1000 MG tablet Commonly known as:  GLUCOPHAGE Take 1 tablet (1,000 mg total) by mouth 2 (two) times daily with a meal.   nitroGLYCERIN 0.2 mg/hr  patch Commonly known as:  NITRODUR - Dosed in mg/24 hr Apply 1/4 of a patch to the affected area and change every 24 hours (re: tendinopathy)   ondansetron 4 MG tablet Commonly known as:  ZOFRAN Take 1 tablet (4 mg total) by mouth every 6 (six) hours.   Pen Needles 31G X 5 MM Misc Inject 1 pen into the skin 3 (three) times daily.

## 2017-01-05 ENCOUNTER — Encounter: Payer: Self-pay | Admitting: Family Medicine

## 2017-01-06 MED ORDER — ATORVASTATIN CALCIUM 40 MG PO TABS: 40.0000 mg | ORAL_TABLET | Freq: Every day | ORAL | 1 refills | Status: DC

## 2017-01-31 LAB — HM DIABETES EYE EXAM

## 2017-02-04 ENCOUNTER — Encounter: Payer: Self-pay | Admitting: Family Medicine

## 2017-03-07 ENCOUNTER — Other Ambulatory Visit: Payer: Self-pay

## 2017-03-07 MED ORDER — PEN NEEDLES 31G X 5 MM MISC: 1.0000 "pen " | Freq: Three times a day (TID) | 0 refills | Status: DC

## 2017-03-21 ENCOUNTER — Other Ambulatory Visit: Payer: Self-pay | Admitting: Family Medicine

## 2017-03-21 DIAGNOSIS — E782 Mixed hyperlipidemia: Secondary | ICD-10-CM

## 2017-03-21 DIAGNOSIS — Z794 Long term (current) use of insulin: Principal | ICD-10-CM

## 2017-03-21 DIAGNOSIS — E119 Type 2 diabetes mellitus without complications: Secondary | ICD-10-CM

## 2017-03-23 ENCOUNTER — Other Ambulatory Visit: Payer: Self-pay

## 2017-03-23 MED ORDER — ATENOLOL 50 MG PO TABS: 50.0000 mg | ORAL_TABLET | Freq: Every day | ORAL | 0 refills | Status: DC

## 2017-03-24 ENCOUNTER — Other Ambulatory Visit: Payer: BC Managed Care – PPO

## 2017-03-30 ENCOUNTER — Other Ambulatory Visit (INDEPENDENT_AMBULATORY_CARE_PROVIDER_SITE_OTHER): Payer: BC Managed Care – PPO

## 2017-03-30 DIAGNOSIS — E782 Mixed hyperlipidemia: Secondary | ICD-10-CM

## 2017-03-30 DIAGNOSIS — Z794 Long term (current) use of insulin: Secondary | ICD-10-CM | POA: Diagnosis not present

## 2017-03-30 DIAGNOSIS — E119 Type 2 diabetes mellitus without complications: Secondary | ICD-10-CM | POA: Diagnosis not present

## 2017-03-30 LAB — HEMOGLOBIN A1C: Hgb A1c MFr Bld: 6.3 % (ref 4.6–6.5)

## 2017-03-30 LAB — COMPREHENSIVE METABOLIC PANEL
ALBUMIN: 4.3 g/dL (ref 3.5–5.2)
ALK PHOS: 61 U/L (ref 39–117)
ALT: 17 U/L (ref 0–53)
AST: 15 U/L (ref 0–37)
BUN: 14 mg/dL (ref 6–23)
CO2: 29 mEq/L (ref 19–32)
CREATININE: 0.86 mg/dL (ref 0.40–1.50)
Calcium: 8.9 mg/dL (ref 8.4–10.5)
Chloride: 104 mEq/L (ref 96–112)
GFR: 106.25 mL/min (ref >60.00)
Glucose, Bld: 119 mg/dL — ABNORMAL HIGH (ref 70–99)
POTASSIUM: 4.3 meq/L (ref 3.5–5.1)
SODIUM: 139 meq/L (ref 135–145)
TOTAL PROTEIN: 6.6 g/dL (ref 6.0–8.3)
Total Bilirubin: 0.8 mg/dL (ref 0.2–1.2)

## 2017-03-30 LAB — LIPID PANEL
CHOLESTEROL: 120 mg/dL (ref 0–200)
HDL: 41.5 mg/dL (ref >39.00)
LDL Cholesterol: 57 mg/dL (ref 0–99)
NonHDL: 78.91
Total CHOL/HDL Ratio: 3
Triglycerides: 110 mg/dL (ref 0.0–149.0)
VLDL: 22 mg/dL (ref 0.0–40.0)

## 2017-03-31 ENCOUNTER — Encounter: Payer: BC Managed Care – PPO | Admitting: Family Medicine

## 2017-04-20 ENCOUNTER — Ambulatory Visit (INDEPENDENT_AMBULATORY_CARE_PROVIDER_SITE_OTHER): Payer: BC Managed Care – PPO | Admitting: Family Medicine

## 2017-04-20 ENCOUNTER — Encounter: Payer: Self-pay | Admitting: Family Medicine

## 2017-04-20 VITALS — BP 120/76 | HR 73 | Temp 98.4°F | Ht 71.0 in | Wt 196.5 lb

## 2017-04-20 DIAGNOSIS — Z0001 Encounter for general adult medical examination with abnormal findings: Secondary | ICD-10-CM

## 2017-04-20 DIAGNOSIS — E782 Mixed hyperlipidemia: Secondary | ICD-10-CM

## 2017-04-20 DIAGNOSIS — Z794 Long term (current) use of insulin: Secondary | ICD-10-CM | POA: Diagnosis not present

## 2017-04-20 DIAGNOSIS — J00 Acute nasopharyngitis [common cold]: Secondary | ICD-10-CM

## 2017-04-20 DIAGNOSIS — E119 Type 2 diabetes mellitus without complications: Secondary | ICD-10-CM

## 2017-04-20 DIAGNOSIS — Z0000 Encounter for general adult medical examination without abnormal findings: Secondary | ICD-10-CM | POA: Insufficient documentation

## 2017-04-20 MED ORDER — ATORVASTATIN CALCIUM 40 MG PO TABS: 40.0000 mg | ORAL_TABLET | Freq: Every day | ORAL | 3 refills | Status: DC

## 2017-04-20 MED ORDER — ONDANSETRON HCL 4 MG PO TABS: 4.0000 mg | ORAL_TABLET | Freq: Four times a day (QID) | ORAL | 0 refills | Status: DC

## 2017-04-20 MED ORDER — ATENOLOL 50 MG PO TABS
50.0000 mg | ORAL_TABLET | Freq: Every day | ORAL | 3 refills | Status: DC
Start: 2017-04-20 — End: 2018-04-28

## 2017-04-20 MED ORDER — METFORMIN HCL 1000 MG PO TABS: 1000.0000 mg | ORAL_TABLET | Freq: Two times a day (BID) | ORAL | 3 refills | Status: DC

## 2017-04-20 NOTE — Assessment & Plan Note (Signed)
Preventative protocols reviewed and updated unless pt declined. Discussed healthy diet and lifestyle.  

## 2017-04-20 NOTE — Progress Notes (Signed)
BP 120/76 (BP Location: Left Arm, Patient Position: Sitting, Cuff Size: Normal)   Pulse 73   Temp 98.4 F (36.9 C) (Oral)   Ht 5\' 11"  (1.803 m)   Wt 196 lb 8 oz (89.1 kg)   SpO2 97%   BMI 27.41 kg/m    CC: CPE Subjective:    Patient ID: Javier Mccoy, male    DOB: [birth year], 36 y.o.   MRN: [id number]  HPI: Javier Mccoy is a 36 y.o. male presenting on 04/20/2017 for Annual Exam ([facility])   Current head cold over last 5 days. Seems to be getting better.   Preventative: Flu shot yearly Pneumovax 04/2016 Seat belt use discussed Sunscreen use discussed. No changing moles on skin. 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 Enjoys cycle, run and kayak.  Diet: good water, fruits/vegetables daily  Relevant past medical, surgical, family and social history reviewed and updated as indicated. Interim medical history since our last visit reviewed. Allergies and medications reviewed and updated. Outpatient Medications Prior to Visit  Medication Sig Dispense Refill  . cetirizine (ZYRTEC) 10 MG tablet Take 10 mg by mouth daily.    . diphenoxylate-atropine (LOMOTIL) 2.5-0.025 MG tablet Take 2 tablets by mouth 4 (four) times daily as needed for diarrhea or loose stools. 30 tablet 0  . glucose blood test strip Use as instructed 100 each 12  . ibuprofen (ADVIL,MOTRIN) 800 MG tablet Take 800 mg by mouth every 8 (eight) hours as needed.    . insulin aspart (NOVOLOG) 100 UNIT/ML injection Inject 6-17 Units into the skin 3 (three) times daily before meals.     . Insulin Detemir (LEVEMIR FLEXPEN) 100 UNIT/ML Pen Inject 65 Units into the skin daily at 10 pm.    . Insulin Pen Needle (PEN NEEDLES) 31G X 5 MM MISC Inject 1 pen 3 (three) times daily into the skin. 300 each 0  . nitroGLYCERIN (NITRODUR - DOSED IN MG/24 HR) 0.2 mg/hr patch  Apply 1/4 of a patch to the affected area and change every 24 hours (re: tendinopathy) 30 patch 4  . atenolol (TENORMIN) 50 MG tablet Take 1 tablet (50 mg total) by mouth daily. 90 tablet 0  . atorvastatin (LIPITOR) 40 MG tablet Take 1 tablet (40 mg total) by mouth daily. 90 tablet 1  . Insulin Detemir (LEVEMIR FLEXPEN) 100 UNIT/ML Pen Inject 50 Units into the skin daily at 10 pm. 45 mL 3  . metFORMIN (GLUCOPHAGE) 1000 MG tablet Take 1 tablet (1,000 mg total) by mouth 2 (two) times daily with a meal. 180 tablet 3  . ondansetron (ZOFRAN) 4 MG tablet Take 1 tablet (4 mg total) by mouth every 6 (six) hours. 20 tablet 0   No facility-administered medications prior to visit.      Per HPI unless specifically indicated in ROS section below Review of Systems  Constitutional: Negative for activity change, appetite change, chills, fatigue, fever and unexpected weight change.  HENT: Positive for congestion and sinus pressure. Negative for hearing loss.   Eyes: Negative for visual disturbance.  Respiratory: Negative for cough, chest tightness, shortness of breath and wheezing.   Cardiovascular: Negative for chest pain, palpitations and leg swelling.  Gastrointestinal: Positive for diarrhea (with certain foods). Negative for abdominal distention, abdominal pain, blood in stool, constipation, nausea and vomiting.  Genitourinary: Negative for difficulty urinating and hematuria.  Musculoskeletal: Negative for arthralgias, myalgias and neck pain.  Skin: Negative for rash.  Neurological: Positive for dizziness and headaches (sinus congestion/head cold). Negative for seizures and syncope.  Hematological: Negative for adenopathy. Does not bruise/bleed easily.  Psychiatric/Behavioral: Negative for dysphoric mood. The patient is not nervous/anxious.        Objective:    BP 120/76 (BP Location: Left Arm, Patient Position: Sitting, Cuff Size: Normal)   Pulse 73   Temp 98.4 F (36.9 C) (Oral)   Ht 5\' 11"   (1.803 m)   Wt 196 lb 8 oz (89.1 kg)   SpO2 97%   BMI 27.41 kg/m   Wt Readings from Last 3 Encounters:  04/20/17 196 lb 8 oz (89.1 kg)  12/13/16 210 lb 12 oz (95.6 kg)  10/11/16 209 lb (94.8 kg)    Physical Exam  Constitutional: He is oriented to person, place, and time. He appears well-developed and well-nourished. No distress.  HENT:  Head: Normocephalic and atraumatic.  Right Ear: Hearing, tympanic membrane, external ear and ear canal normal.  Left Ear: Hearing, tympanic membrane, external ear and ear canal normal.  Nose: Nose normal.  Mouth/Throat: Uvula is midline, oropharynx is clear and moist and mucous membranes are normal. No oropharyngeal exudate, posterior oropharyngeal edema or posterior oropharyngeal erythema.  Eyes: Conjunctivae and EOM are normal. Pupils are equal, round, and reactive to light. No scleral icterus.  Neck: Normal range of motion. Neck supple.  Cardiovascular: Normal rate, regular rhythm, normal heart sounds and intact distal pulses.  No murmur heard. Pulses:      Radial pulses are 2+ on the right side, and 2+ on the left side.  Pulmonary/Chest: Effort normal and breath sounds normal. No respiratory distress. He has no wheezes. He has no rales.  Abdominal: Soft. Bowel sounds are normal. He exhibits no distension and no mass. There is no tenderness. There is no rebound and no guarding.  Musculoskeletal: Normal range of motion. He exhibits no edema.  Lymphadenopathy:    He has no cervical adenopathy.  Neurological: He is alert and oriented to person, place, and time.  CN grossly intact, station and gait intact  Skin: Skin is warm and dry. No rash noted.  Psychiatric: He has a normal mood and affect. His behavior is normal. Judgment and thought content normal.  Nursing note and vitals reviewed.  Results for orders placed or performed in visit on 03/30/17  Hemoglobin A1c  Result Value Ref Range   Hgb A1c MFr Bld 6.3 4.6 - 6.5 %  Comprehensive metabolic  panel  Result Value Ref Range   Sodium 139 135 - 145 mEq/L   Potassium 4.3 3.5 - 5.1 mEq/L   Chloride 104 96 - 112 mEq/L   CO2 29 19 - 32 mEq/L   Glucose, Bld 119 (H) 70 - 99 mg/dL   BUN 14 6 - 23 mg/dL   Creatinine, Ser 8.410.86 0.40 - 1.50 mg/dL   Total Bilirubin 0.8 0.2 - 1.2 mg/dL   Alkaline Phosphatase 61 39 - 117 U/L   AST 15 0 - 37 U/L   ALT 17 0 - 53 U/L   Total Protein 6.6 6.0 - 8.3 g/dL   Albumin 4.3 3.5 - 5.2 g/dL   Calcium 8.9 8.4 - 32.410.5 mg/dL   GFR 401.02106.25 >72.53>60.00 mL/min  Lipid panel  Result Value Ref Range   Cholesterol 120 0 - 200 mg/dL   Triglycerides 664.4110.0 0.0 - 149.0 mg/dL   HDL 03.4741.50 >42.59>39.00 mg/dL   VLDL 22.0  0.0 - 40.0 mg/dL   LDL Cholesterol 57 0 - 99 mg/dL   Total CHOL/HDL Ratio 3    NonHDL 78.91       Assessment & Plan:   Problem List Items Addressed This Visit    Health maintenance examination - Primary    Preventative protocols reviewed and updated unless pt declined. Discussed healthy diet and lifestyle.       Mixed hyperlipidemia    Chronic, wonderful control on atorvastatin 40mg  daily.       Relevant Medications   atenolol (TENORMIN) 50 MG tablet   atorvastatin (LIPITOR) 40 MG tablet   Type 2 diabetes mellitus (HCC)    Chronic, excellent control without significant hypoglycemia. Continue current regimen.  novolog mealtime regimen is usually 8u breakfast, 12u lunch, 15u dinner.       Relevant Medications   atorvastatin (LIPITOR) 40 MG tablet   metFORMIN (GLUCOPHAGE) 1000 MG tablet   Insulin Detemir (LEVEMIR FLEXPEN) 100 UNIT/ML Pen    Other Visit Diagnoses    Head cold         discussed supportive care for head cold. Anticipate viral. Advised update if symptoms past 10 days.   Follow up plan: Return in about 6 months (around 10/19/2017) for follow up visit.  Eustaquio BoydenJavier Onita Pfluger, MD

## 2017-04-20 NOTE — Patient Instructions (Signed)
You are doing well today Continue current regimen. Continue regular aerobic exercise. Return as needed or in 6 months for diabetes follow up visit.   Health Maintenance, Male A healthy lifestyle and preventive care is important for your health and wellness. Ask your health care provider about what schedule of regular examinations is right for you. What should I know about weight and diet? Eat a Healthy Diet  Eat plenty of vegetables, fruits, whole grains, low-fat dairy products, and lean protein.  Do not eat a lot of foods high in solid fats, added sugars, or salt.  Maintain a Healthy Weight Regular exercise can help you achieve or maintain a healthy weight. You should:  Do at least 150 minutes of exercise each week. The exercise should increase your heart rate and make you sweat (moderate-intensity exercise).  Do strength-training exercises at least twice a week.  Watch Your Levels of Cholesterol and Blood Lipids  Have your blood tested for lipids and cholesterol every 5 years starting at 37 years of age. If you are at high risk for heart disease, you should start having your blood tested when you are 37 years old. You may need to have your cholesterol levels checked more often if: ? Your lipid or cholesterol levels are high. ? You are older than 37 years of age. ? You are at high risk for heart disease.  What should I know about cancer screening? Many types of cancers can be detected early and may often be prevented. Lung Cancer  You should be screened every year for lung cancer if: ? You are a current smoker who has smoked for at least 30 years. ? You are a former smoker who has quit within the past 15 years.  Talk to your health care provider about your screening options, when you should start screening, and how often you should be screened.  Colorectal Cancer  Routine colorectal cancer screening usually begins at 37 years of age and should be repeated every 5-10 years  until you are 37 years old. You may need to be screened more often if early forms of precancerous polyps or small growths are found. Your health care provider may recommend screening at an earlier age if you have risk factors for colon cancer.  Your health care provider may recommend using home test kits to check for hidden blood in the stool.  A small camera at the end of a tube can be used to examine your colon (sigmoidoscopy or colonoscopy). This checks for the earliest forms of colorectal cancer.  Prostate and Testicular Cancer  Depending on your age and overall health, your health care provider may do certain tests to screen for prostate and testicular cancer.  Talk to your health care provider about any symptoms or concerns you have about testicular or prostate cancer.  Skin Cancer  Check your skin from head to toe regularly.  Tell your health care provider about any new moles or changes in moles, especially if: ? There is a change in a mole's size, shape, or color. ? You have a mole that is larger than a pencil eraser.  Always use sunscreen. Apply sunscreen liberally and repeat throughout the day.  Protect yourself by wearing long sleeves, pants, a wide-brimmed hat, and sunglasses when outside.  What should I know about heart disease, diabetes, and high blood pressure?  If you are 2918-37 years of age, have your blood pressure checked every 3-5 years. If you are 440 years of age or older,  have your blood pressure checked every year. You should have your blood pressure measured twice-once when you are at a hospital or clinic, and once when you are not at a hospital or clinic. Record the average of the two measurements. To check your blood pressure when you are not at a hospital or clinic, you can use: ? An automated blood pressure machine at a pharmacy. ? A home blood pressure monitor.  Talk to your health care provider about your target blood pressure.  If you are between 7345-37  years old, ask your health care provider if you should take aspirin to prevent heart disease.  Have regular diabetes screenings by checking your fasting blood sugar level. ? If you are at a normal weight and have a low risk for diabetes, have this test once every three years after the age of 37. ? If you are overweight and have a high risk for diabetes, consider being tested at a younger age or more often.  A one-time screening for abdominal aortic aneurysm (AAA) by ultrasound is recommended for men aged 65-75 years who are current or former smokers. What should I know about preventing infection? Hepatitis B If you have a higher risk for hepatitis B, you should be screened for this virus. Talk with your health care provider to find out if you are at risk for hepatitis B infection. Hepatitis C Blood testing is recommended for:  Everyone born from [id number] through 1966.  Anyone with known risk factors for hepatitis C.  Sexually Transmitted Diseases (STDs)  You should be screened each year for STDs including gonorrhea and chlamydia if: ? You are sexually active and are younger than 37 years of age. ? You are older than 37 years of age and your health care provider tells you that you are at risk for this type of infection. ? Your sexual activity has changed since you were last screened and you are at an increased risk for chlamydia or gonorrhea. Ask your health care provider if you are at risk.  Talk with your health care provider about whether you are at high risk of being infected with HIV. Your health care provider may recommend a prescription medicine to help prevent HIV infection.  What else can I do?  Schedule regular health, dental, and eye exams.  Stay current with your vaccines (immunizations).  Do not use any tobacco products, such as cigarettes, chewing tobacco, and e-cigarettes. If you need help quitting, ask your health care provider.  Limit alcohol intake to no more than 2  drinks per day. One drink equals 12 ounces of beer, 5 ounces of wine, or 1 ounces of hard liquor.  Do not use street drugs.  Do not share needles.  Ask your health care provider for help if you need support or information about quitting drugs.  Tell your health care provider if you often feel depressed.  Tell your health care provider if you have ever been abused or do not feel safe at home. This information is not intended to replace advice given to you by your health care provider. Make sure you discuss any questions you have with your health care provider. Document Released: 10/09/2007 Document Revised: 12/10/2015 Document Reviewed: 01/14/2015 Elsevier Interactive Patient Education  [employer]2018 [employer]

## 2017-04-20 NOTE — Assessment & Plan Note (Signed)
Chronic, wonderful control on atorvastatin 40mg  daily.

## 2017-04-20 NOTE — Assessment & Plan Note (Addendum)
Chronic, excellent control without significant hypoglycemia. Continue current regimen.  novolog mealtime regimen is usually 8u breakfast, 12u lunch, 15u dinner.

## 2017-07-27 ENCOUNTER — Ambulatory Visit (INDEPENDENT_AMBULATORY_CARE_PROVIDER_SITE_OTHER)
Admission: RE | Admit: 2017-07-27 | Discharge: 2017-07-27 | Disposition: A | Discharge status: Home / Self Care | Payer: BC Managed Care – PPO | Source: Ambulatory Visit | Attending: Family Medicine | Admitting: Family Medicine

## 2017-07-27 ENCOUNTER — Ambulatory Visit: Payer: BC Managed Care – PPO | Admitting: Family Medicine

## 2017-07-27 ENCOUNTER — Encounter: Payer: Self-pay | Admitting: Family Medicine

## 2017-07-27 VITALS — BP 118/74 | HR 80 | Temp 98.4°F | Wt 191.0 lb

## 2017-07-27 DIAGNOSIS — Z794 Long term (current) use of insulin: Secondary | ICD-10-CM

## 2017-07-27 DIAGNOSIS — R059 Cough, unspecified: Secondary | ICD-10-CM

## 2017-07-27 DIAGNOSIS — E1169 Type 2 diabetes mellitus with other specified complication: Secondary | ICD-10-CM

## 2017-07-27 DIAGNOSIS — J189 Pneumonia, unspecified organism: Secondary | ICD-10-CM

## 2017-07-27 DIAGNOSIS — R05 Cough: Secondary | ICD-10-CM | POA: Diagnosis not present

## 2017-07-27 HISTORY — DX: Pneumonia, unspecified organism: J18.9

## 2017-07-27 MED ORDER — AZITHROMYCIN 250 MG PO TABS: ORAL_TABLET | ORAL | 0 refills | Status: DC

## 2017-07-27 MED ORDER — CEFTRIAXONE SODIUM 1 G IJ SOLR
1.0000 g | Freq: Once | INTRAMUSCULAR | Status: AC
  Administered 2017-07-27: 1 g via INTRAMUSCULAR

## 2017-07-27 NOTE — Patient Instructions (Signed)
I think you have a pneumonia - xray today.  Treat with shot of antibiotic today and then zpack antibiotic.  May continue tessalon during the day and mucinex.  Push fluids and plenty of rest. Watch for persistent fever >101, worsening productive cough or not improving as expected.

## 2017-07-27 NOTE — Progress Notes (Signed)
BP 118/74 (BP Location: Left Arm, Patient Position: Sitting, Cuff Size: Normal)   Pulse 80   Temp 98.4 F (36.9 C) (Oral)   Wt 191 lb (86.6 kg)   SpO2 94%   BMI 26.64 kg/m    CC: cough Subjective:    Patient ID: Javier Mccoy, male    DOB: 1980-11-05, 37 y.o.   MRN: [id number]  HPI: Javier Mccoy is a 37 y.o. male presenting on 07/27/2017 for Cough (Productive cough started 07/21/17, worsened. Also, c/o chest and head congestion. Other sxs seem to be improving but the cough. Says bronchitis is going around his office.  Tried Mucinex and Robitussin, not helpful. )   1 wk h/o chest and head congestion, productive cough, some earaches, ST with PNDrainage. Some dyspnea and wheezing. Felt worse over the weekend.   No fevers/chills, tooth pain.   Has tried OTC remedies without much improvement (mucinex, robitussin DM, tylenol cold). Has tried some tessalon perls as well.  Sick contacts at work H/o allergic rhinitis - treats with zyrtec.  No h/o asthma. Non smoker. Known well controlled diabetic Lab Results  Component Value Date   HGBA1C 6.3 03/30/2017     Relevant past medical, surgical, family and social history reviewed and updated as indicated. Interim medical history since our last visit reviewed. Allergies and medications reviewed and updated. Outpatient Medications Prior to Visit  Medication Sig Dispense Refill  . atenolol (TENORMIN) 50 MG tablet Take 1 tablet (50 mg total) by mouth daily. 90 tablet 3  . atorvastatin (LIPITOR) 40 MG tablet Take 1 tablet (40 mg total) by mouth daily. 90 tablet 3  . cetirizine (ZYRTEC) 10 MG tablet Take 10 mg by mouth daily.    . diphenoxylate-atropine (LOMOTIL) 2.5-0.025 MG tablet Take 2 tablets by mouth 4 (four) times daily as needed for diarrhea or loose stools. 30 tablet 0  . glucose blood test strip Use as instructed 100 each 12  . ibuprofen (ADVIL,MOTRIN) 800 MG tablet Take 800 mg by mouth every 8 (eight) hours as needed.    .  insulin aspart (NOVOLOG) 100 UNIT/ML injection Inject 6-17 Units into the skin 3 (three) times daily before meals.     . Insulin Detemir (LEVEMIR FLEXPEN) 100 UNIT/ML Pen Inject 65 Units into the skin daily at 10 pm.    . Insulin Pen Needle (PEN NEEDLES) 31G X 5 MM MISC Inject 1 pen 3 (three) times daily into the skin. 300 each 0  . metFORMIN (GLUCOPHAGE) 1000 MG tablet Take 1 tablet (1,000 mg total) by mouth 2 (two) times daily with a meal. 180 tablet 3  . nitroGLYCERIN (NITRODUR - DOSED IN MG/24 HR) 0.2 mg/hr patch Apply 1/4 of a patch to the affected area and change every 24 hours (re: tendinopathy) 30 patch 4  . ondansetron (ZOFRAN) 4 MG tablet Take 1 tablet (4 mg total) by mouth every 6 (six) hours. 30 tablet 0   No facility-administered medications prior to visit.      Per HPI unless specifically indicated in ROS section below Review of Systems     Objective:    BP 118/74 (BP Location: Left Arm, Patient Position: Sitting, Cuff Size: Normal)   Pulse 80   Temp 98.4 F (36.9 C) (Oral)   Wt 191 lb (86.6 kg)   SpO2 94%   BMI 26.64 kg/m   Wt Readings from Last 3 Encounters:  07/27/17 191 lb (86.6 kg)  04/20/17 196 lb 8 oz (89.1 kg)  12/13/16 210  lb 12 oz (95.6 kg)    Physical Exam  Constitutional: He appears well-developed and well-nourished. No distress.  HENT:  Head: Normocephalic and atraumatic.  Right Ear: Hearing, tympanic membrane, external ear and ear canal normal.  Left Ear: Hearing, tympanic membrane, external ear and ear canal normal.  Nose: Mucosal edema present. No rhinorrhea. Right sinus exhibits no maxillary sinus tenderness and no frontal sinus tenderness. Left sinus exhibits no maxillary sinus tenderness and no frontal sinus tenderness.  Mouth/Throat: Uvula is midline, oropharynx is clear and moist and mucous membranes are normal. No oropharyngeal exudate, posterior oropharyngeal edema, posterior oropharyngeal erythema or tonsillar abscesses.  Evidently congested    Eyes: Pupils are equal, round, and reactive to light. Conjunctivae and EOM are normal. No scleral icterus.  Neck: Normal range of motion. Neck supple.  Cardiovascular: Normal rate, regular rhythm, normal heart sounds and intact distal pulses.  No murmur heard. Pulmonary/Chest: Effort normal. No respiratory distress. He has decreased breath sounds. He has no wheezes. He has no rhonchi. He has rales (RLL).  Lymphadenopathy:    He has no cervical adenopathy.  Skin: Skin is warm and dry. No rash noted.  Nursing note and vitals reviewed.  Results for orders placed or performed in visit on 03/30/17  Hemoglobin A1c  Result Value Ref Range   Hgb A1c MFr Bld 6.3 4.6 - 6.5 %  Comprehensive metabolic panel  Result Value Ref Range   Sodium 139 135 - 145 mEq/L   Potassium 4.3 3.5 - 5.1 mEq/L   Chloride 104 96 - 112 mEq/L   CO2 29 19 - 32 mEq/L   Glucose, Bld 119 (H) 70 - 99 mg/dL   BUN 14 6 - 23 mg/dL   Creatinine, Ser 1.61 0.40 - 1.50 mg/dL   Total Bilirubin 0.8 0.2 - 1.2 mg/dL   Alkaline Phosphatase 61 39 - 117 U/L   AST 15 0 - 37 U/L   ALT 17 0 - 53 U/L   Total Protein 6.6 6.0 - 8.3 g/dL   Albumin 4.3 3.5 - 5.2 g/dL   Calcium 8.9 8.4 - 09.6 mg/dL   GFR 045.40 >98.11 mL/min  Lipid panel  Result Value Ref Range   Cholesterol 120 0 - 200 mg/dL   Triglycerides 914.7 0.0 - 149.0 mg/dL   HDL 82.95 >62.13 mg/dL   VLDL 08.6 0.0 - 57.8 mg/dL   LDL Cholesterol 57 0 - 99 mg/dL   Total CHOL/HDL Ratio 3    NonHDL 78.91       Assessment & Plan:   Problem List Items Addressed This Visit    CAP (community acquired pneumonia) - Primary    Of 1 wk duration, recent worsening, with abnormal lung exam. Check CXR - suspicious for bilateral lower lobe hazy infiltrates - Rx rocephin 1gm IM x1 then zpack antibiotic. Rec push fluids and plenty of rest. Update if not improving with treatment. Pt agrees with plan.       Relevant Medications   azithromycin (ZITHROMAX) 250 MG tablet   cefTRIAXone  (ROCEPHIN) injection 1 g (Completed)   Type 2 diabetes mellitus with other specified complication (HCC)    Other Visit Diagnoses    Cough       Relevant Orders   DG Chest 2 View       Meds ordered this encounter  Medications  . azithromycin (ZITHROMAX) 250 MG tablet    Sig: Take two tablets on day one followed by one tablet on days 2-5    Dispense:  6 each    Refill:  0  . cefTRIAXone (ROCEPHIN) injection 1 g   Orders Placed This Encounter  Procedures  . DG Chest 2 View    Standing Status:   Future    Number of Occurrences:   1    Standing Expiration Date:   09/27/2018    Order Specific Question:   Reason for Exam (SYMPTOM  OR DIAGNOSIS REQUIRED)    Answer:   cough, RLL rales    Order Specific Question:   Preferred imaging location?    Answer:   [facility]    Order Specific Question:   Radiology Contrast Protocol - do NOT remove file path    Answer:   [email or web address]    Follow up plan: Return if symptoms worsen or fail to improve.  Eustaquio Boyden, MD

## 2017-07-27 NOTE — Assessment & Plan Note (Addendum)
Of 1 wk duration, recent worsening, with abnormal lung exam. Check CXR - suspicious for bilateral lower lobe hazy infiltrates - Rx rocephin 1gm IM x1 then zpack antibiotic. Rec push fluids and plenty of rest. Update if not improving with treatment. Pt agrees with plan.

## 2017-07-28 ENCOUNTER — Encounter: Payer: Self-pay | Admitting: Family Medicine

## 2017-07-29 ENCOUNTER — Other Ambulatory Visit: Payer: Self-pay | Admitting: Family Medicine

## 2017-07-29 MED ORDER — BENZONATATE 100 MG PO CAPS: 100.0000 mg | ORAL_CAPSULE | Freq: Three times a day (TID) | ORAL | 0 refills | Status: DC | PRN

## 2017-07-31 IMAGING — US US ABDOMEN LIMITED
1 series · 14 of 25 positions shown · non-contrast
Comparison: None.

CLINICAL DATA: Initial evaluation for acute right upper quadrant
pain since yesterday, worsening.

EXAM:
US ABDOMEN LIMITED - RIGHT UPPER QUADRANT

[Series 1: us abdomen limited · 0.20mm/px · 14 of 49 slices shown]
[im 1/49]
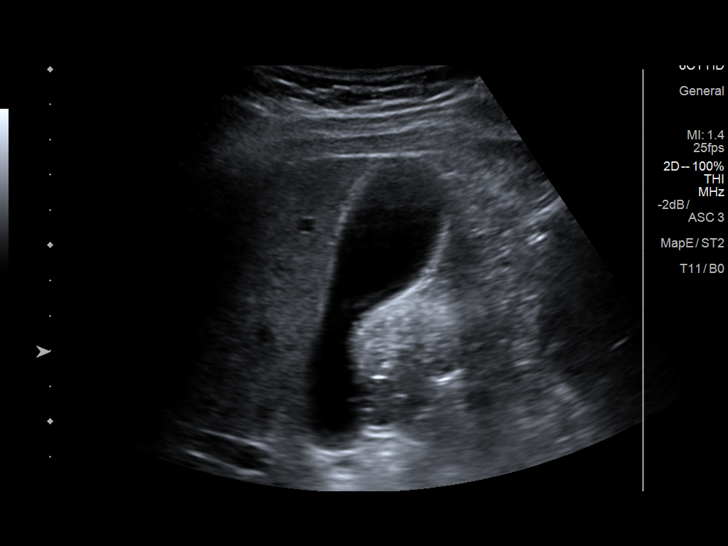
[im 5/49]
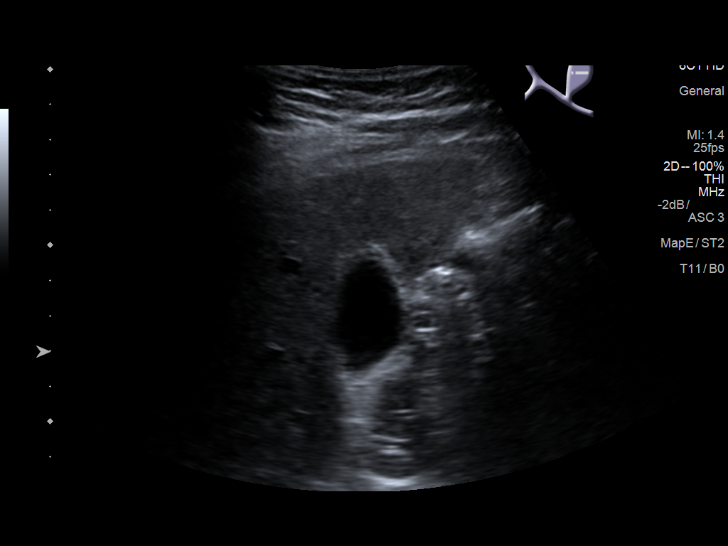
[im 9/49]
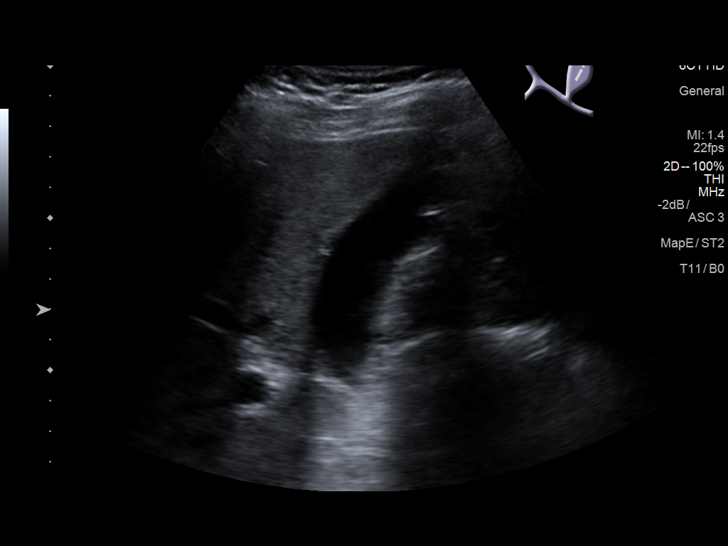
[im 13/49]
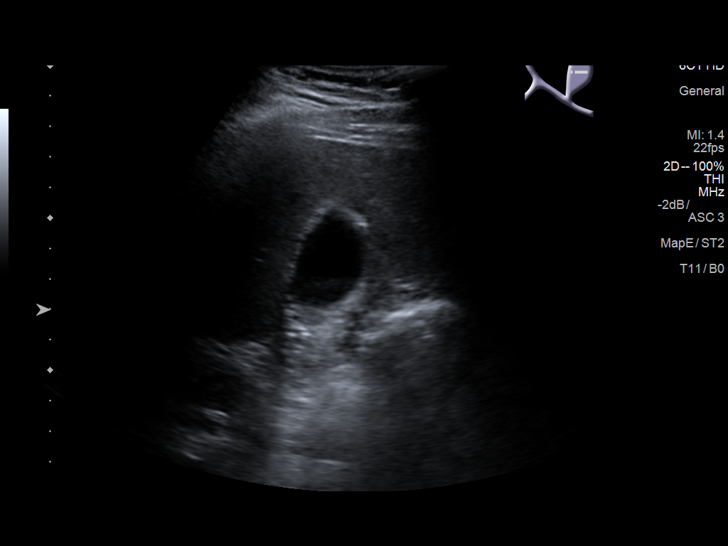
[im 17/49]
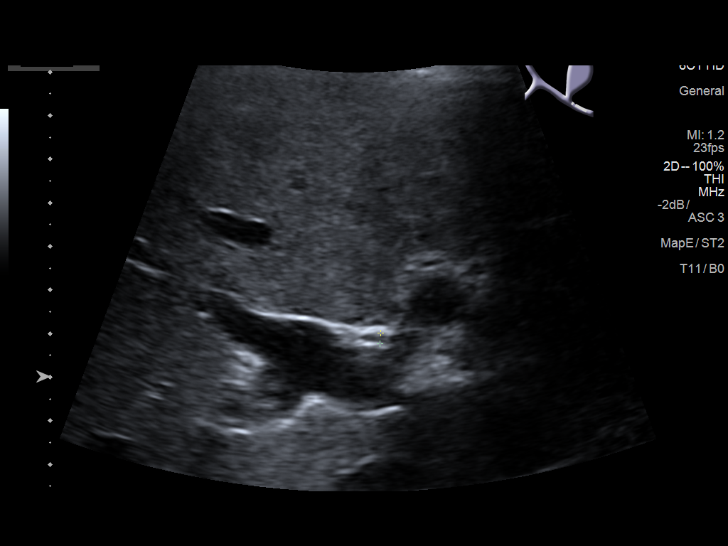
[im 19/49]
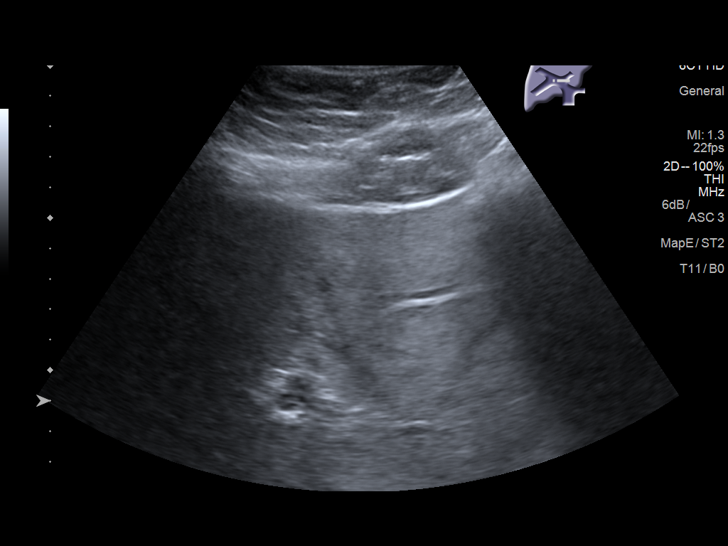
[im 23/49]
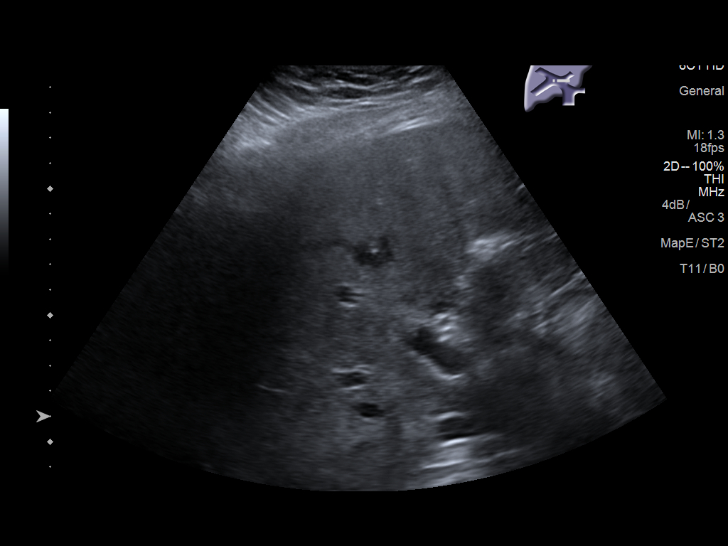
[im 27/49]
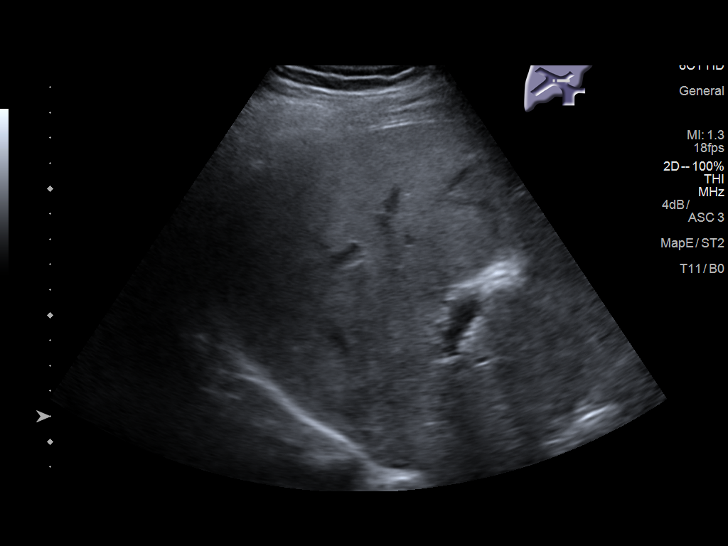
[im 31/49]
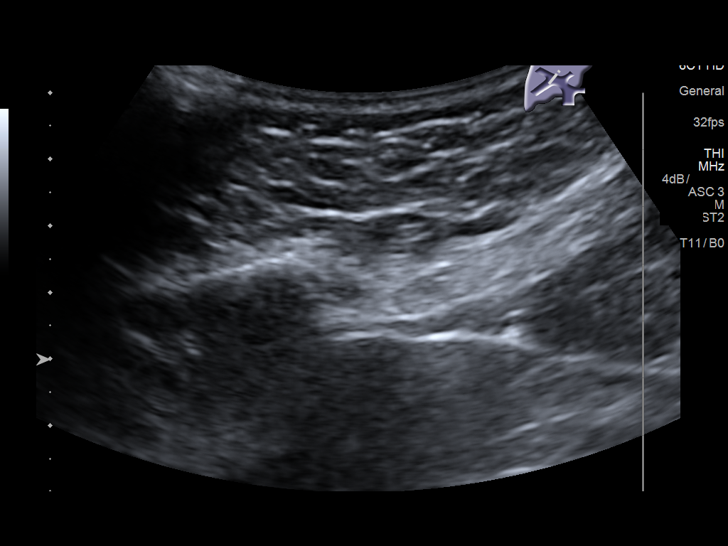
[im 33/49]
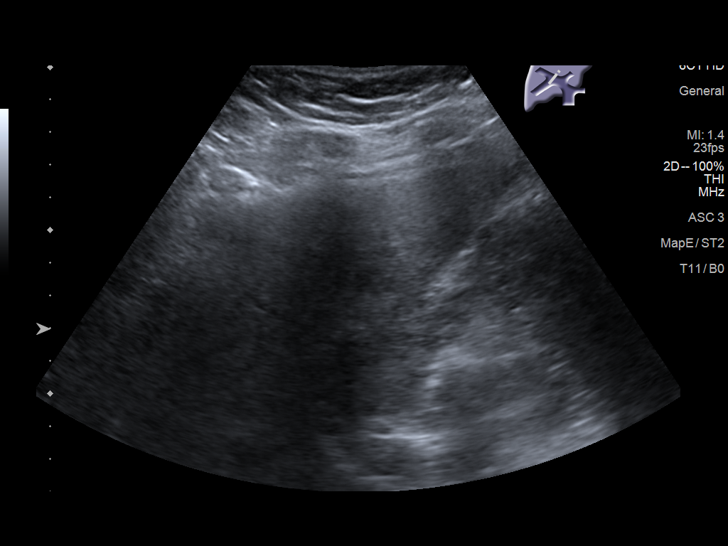
[im 37/49]
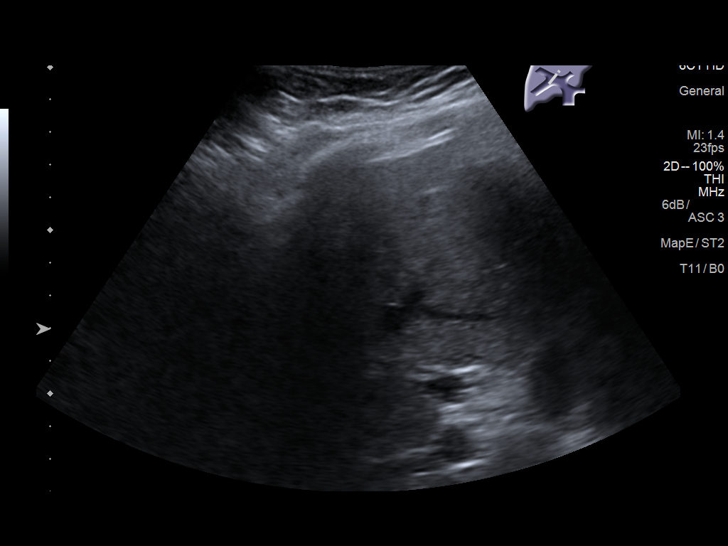
[im 41/49]
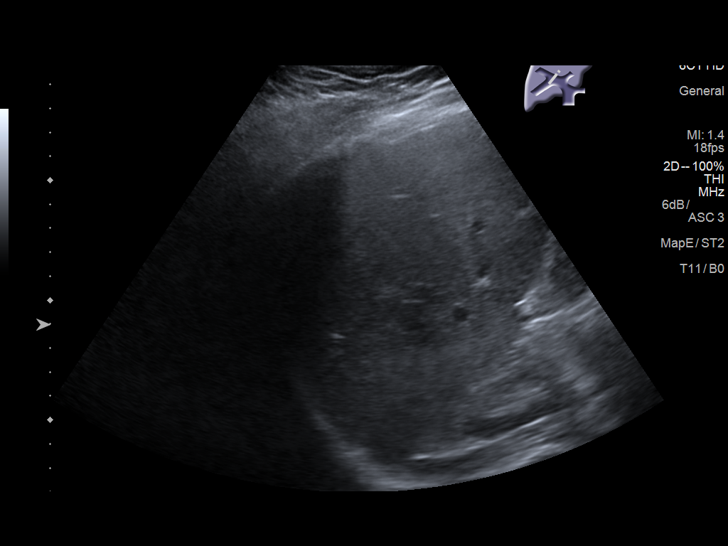
[im 45/49]
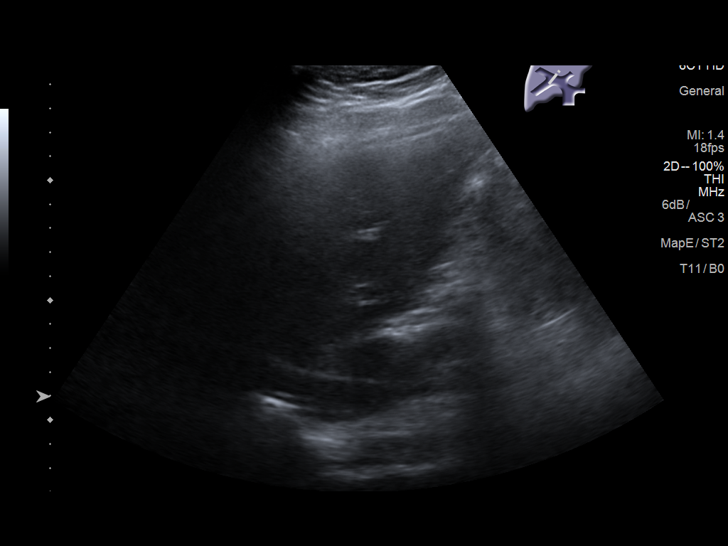
[im 49/49]
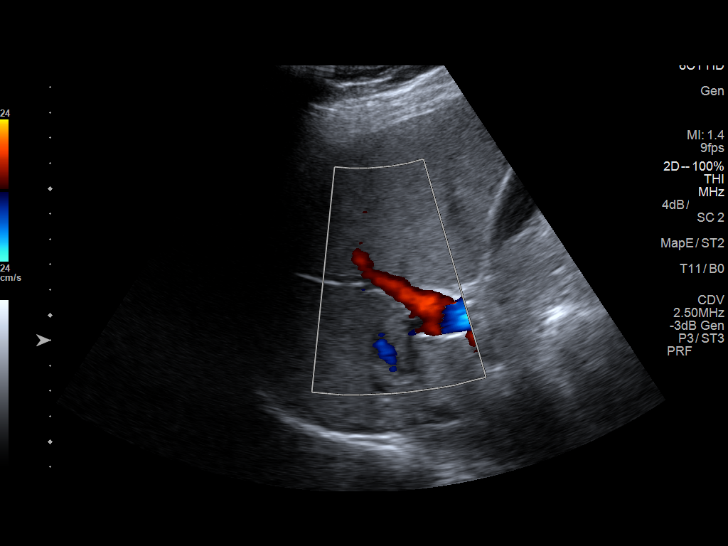

[14 of 25 positions shown; findings below may reference images not displayed]

FINDINGS: Gallbladder:

No gallstones or wall thickening visualized. No sonographic Murphy
sign noted by sonographer.

Common bile duct:

Diameter: 3 mm

Liver:

No focal lesion identified. Within normal limits in parenchymal
echogenicity. Mild lobulation of the hepatic contour noted.
IMPRESSION: Normal right upper quadrant ultrasound. No sonographic evidence for
cholelithiasis, acute cholecystitis, or biliary dilatation.

## 2017-08-03 ENCOUNTER — Other Ambulatory Visit: Payer: Self-pay | Admitting: Family Medicine

## 2017-11-11 ENCOUNTER — Other Ambulatory Visit: Payer: Self-pay | Admitting: Family Medicine

## 2018-01-22 ENCOUNTER — Encounter: Payer: Self-pay | Admitting: Family Medicine

## 2018-01-23 NOTE — Telephone Encounter (Signed)
I'm not sure how to write up this pt's refill.

## 2018-01-24 MED ORDER — INSULIN ASPART 100 UNIT/ML ~~LOC~~ SOLN: 6.0000 [IU] | Freq: Three times a day (TID) | SUBCUTANEOUS | 3 refills | Status: DC

## 2018-01-24 NOTE — Addendum Note (Signed)
Addended by: Eustaquio Boyden on: 01/24/2018 08:01 AM   Modules accepted: Orders

## 2018-01-25 ENCOUNTER — Ambulatory Visit: Payer: BC Managed Care – PPO | Admitting: Family Medicine

## 2018-01-26 ENCOUNTER — Telehealth: Payer: Self-pay | Admitting: Family Medicine

## 2018-01-26 NOTE — Telephone Encounter (Addendum)
[facility] needs clarification for Novolog rx.  Placed fax from [facility] in Dr. Timoteo Expose box.

## 2018-01-26 NOTE — Telephone Encounter (Signed)
Spoke with [facility] informing them that pt uses vials for his insulin.  This info was documented and they will process refill for pt.

## 2018-01-26 NOTE — Telephone Encounter (Signed)
Spoke with [facility] clarifying with them pt uses vials for his insulin. [See TE, 01/26/18.]

## 2018-01-26 NOTE — Telephone Encounter (Signed)
Copied from CRM [id number]. Topic: Quick Communication - See Telephone Encounter >> Jan 26, 2018  9:34 AM Stephannie Li, NT wrote: CRM for notification. See Telephone encounter for: 01/26/18. Pharmacy called and is asking if the patient is using vials or pens please call [phone] ref#  [phone], they also state this is their final attempt to contact the office since  they have faxed over several requests and have not received a response .

## 2018-01-27 ENCOUNTER — Ambulatory Visit: Payer: BC Managed Care – PPO | Admitting: Family Medicine

## 2018-01-27 ENCOUNTER — Encounter: Payer: Self-pay | Admitting: Family Medicine

## 2018-01-27 VITALS — BP 118/74 | HR 71 | Temp 98.0°F | Ht 71.0 in

## 2018-01-27 DIAGNOSIS — S92351A Displaced fracture of fifth metatarsal bone, right foot, initial encounter for closed fracture: Secondary | ICD-10-CM

## 2018-01-27 DIAGNOSIS — Z23 Encounter for immunization: Secondary | ICD-10-CM | POA: Diagnosis not present

## 2018-01-27 DIAGNOSIS — E1169 Type 2 diabetes mellitus with other specified complication: Secondary | ICD-10-CM | POA: Diagnosis not present

## 2018-01-27 DIAGNOSIS — Z794 Long term (current) use of insulin: Secondary | ICD-10-CM

## 2018-01-27 HISTORY — DX: Displaced fracture of fifth metatarsal bone, right foot, initial encounter for closed fracture: S92.351A

## 2018-01-27 LAB — MICROALBUMIN / CREATININE URINE RATIO
Creatinine,U: 301.1 mg/dL
MICROALB UR: 1.8 mg/dL (ref 0.0–1.9)
MICROALB/CREAT RATIO: 0.6 mg/g (ref 0.0–30.0)

## 2018-01-27 LAB — POCT GLYCOSYLATED HEMOGLOBIN (HGB A1C): Hemoglobin A1C: 7.1 % — AB (ref 4.0–5.6)

## 2018-01-27 MED ORDER — INSULIN ASPART 100 UNIT/ML ~~LOC~~ SOLN: 6.0000 [IU] | Freq: Three times a day (TID) | SUBCUTANEOUS | 3 refills | Status: DC

## 2018-01-27 MED ORDER — INSULIN ASPART 100 UNIT/ML ~~LOC~~ SOLN: 6.0000 [IU] | Freq: Three times a day (TID) | SUBCUTANEOUS | 0 refills | Status: DC

## 2018-01-27 MED ORDER — INSULIN DETEMIR 100 UNIT/ML FLEXPEN: 65.0000 [IU] | PEN_INJECTOR | Freq: Every day | SUBCUTANEOUS | 3 refills | Status: DC
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
## 2018-01-27 NOTE — Addendum Note (Signed)
Addended by: Nanci Pina on: 01/27/2018 09:19 AM   Modules accepted: Orders

## 2018-01-27 NOTE — Patient Instructions (Addendum)
Flu shot today. Urine microalbumin today. Continue current medicines.  Return in 3 months for physical. I have sent in pens of levemir and novolog - have sent novolog locally for the next refill.

## 2018-01-27 NOTE — Telephone Encounter (Signed)
Pt here today for OV.  He informed us he actually uses the pens vs the vials.  Called [facility] cust svc at [phone] to inform them of my error in ordering the vials for the pt.  Told to have pt call cust svc and tell them he received an order in error from the provider.  Told to have pt mention code: MDO so they can document it and credit the pt.  Also, was given phn # [phone] if any issues come up.  I spoke with the pt and provided the above info and instructions in writing. Pt verbalized understanding.  I also apologized to the pt for the mistake.  [Dr. G sent an immediate rx to pt's local pharmacy and a correct rx to [facility].]

## 2018-01-27 NOTE — Assessment & Plan Note (Signed)
Appreciate ortho care.  

## 2018-01-27 NOTE — Assessment & Plan Note (Addendum)
Chronic, mildly deteriorated to A1c 7.1% - exercise limited by recent foot fracture. Pt motivated to work towards decreasing A1c below 7%.  Continue current regimen.  Yesterday we sent request for novolog vials to mail order pharmacy - but patient uses levemir and novolog pens - I have sent novolog pen Rx locally to pharmacy.

## 2018-01-30 MED ORDER — INSULIN ASPART 100 UNIT/ML FLEXPEN: 6.0000 [IU] | PEN_INJECTOR | Freq: Three times a day (TID) | SUBCUTANEOUS | 1 refills | Status: DC

## 2018-04-28 ENCOUNTER — Other Ambulatory Visit: Payer: Self-pay | Admitting: Family Medicine

## 2018-04-30 ENCOUNTER — Other Ambulatory Visit: Payer: Self-pay | Admitting: Family Medicine

## 2018-04-30 DIAGNOSIS — E782 Mixed hyperlipidemia: Secondary | ICD-10-CM

## 2018-04-30 DIAGNOSIS — Z794 Long term (current) use of insulin: Principal | ICD-10-CM

## 2018-04-30 DIAGNOSIS — E1169 Type 2 diabetes mellitus with other specified complication: Secondary | ICD-10-CM

## 2018-05-01 ENCOUNTER — Other Ambulatory Visit (INDEPENDENT_AMBULATORY_CARE_PROVIDER_SITE_OTHER): Payer: BC Managed Care – PPO

## 2018-05-01 DIAGNOSIS — Z794 Long term (current) use of insulin: Secondary | ICD-10-CM | POA: Diagnosis not present

## 2018-05-01 DIAGNOSIS — E782 Mixed hyperlipidemia: Secondary | ICD-10-CM

## 2018-05-01 DIAGNOSIS — E1169 Type 2 diabetes mellitus with other specified complication: Secondary | ICD-10-CM | POA: Diagnosis not present

## 2018-05-01 LAB — LIPID PANEL
CHOL/HDL RATIO: 4
CHOLESTEROL: 158 mg/dL (ref 0–200)
HDL: 40.9 mg/dL (ref >39.00)
LDL CALC: 89 mg/dL (ref 0–99)
NonHDL: 117.47
TRIGLYCERIDES: 143 mg/dL (ref 0.0–149.0)
VLDL: 28.6 mg/dL (ref 0.0–40.0)

## 2018-05-01 LAB — COMPREHENSIVE METABOLIC PANEL
ALT: 21 U/L (ref 0–53)
AST: 14 U/L (ref 0–37)
Albumin: 4.2 g/dL (ref 3.5–5.2)
Alkaline Phosphatase: 67 U/L (ref 39–117)
BUN: 20 mg/dL (ref 6–23)
CHLORIDE: 104 meq/L (ref 96–112)
CO2: 28 meq/L (ref 19–32)
CREATININE: 0.86 mg/dL (ref 0.40–1.50)
Calcium: 9.2 mg/dL (ref 8.4–10.5)
GFR: 105.63 mL/min (ref >60.00)
Glucose, Bld: 139 mg/dL — ABNORMAL HIGH (ref 70–99)
POTASSIUM: 3.8 meq/L (ref 3.5–5.1)
SODIUM: 140 meq/L (ref 135–145)
Total Bilirubin: 0.8 mg/dL (ref 0.2–1.2)
Total Protein: 6.5 g/dL (ref 6.0–8.3)

## 2018-05-01 LAB — MICROALBUMIN / CREATININE URINE RATIO
CREATININE, U: 242.5 mg/dL
MICROALB UR: 3.9 mg/dL — AB (ref 0.0–1.9)
MICROALB/CREAT RATIO: 1.6 mg/g (ref 0.0–30.0)

## 2018-05-01 LAB — HEMOGLOBIN A1C: Hgb A1c MFr Bld: 7.8 % — ABNORMAL HIGH (ref 4.6–6.5)

## 2018-05-03 ENCOUNTER — Encounter: Payer: Self-pay | Admitting: Family Medicine
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
## 2018-05-04 ENCOUNTER — Encounter: Payer: Self-pay | Admitting: Family Medicine

## 2018-05-04 ENCOUNTER — Ambulatory Visit (INDEPENDENT_AMBULATORY_CARE_PROVIDER_SITE_OTHER): Payer: BC Managed Care – PPO | Admitting: Family Medicine

## 2018-05-04 VITALS — BP 118/76 | HR 83 | Temp 97.8°F | Ht 71.5 in | Wt 198.2 lb

## 2018-05-04 DIAGNOSIS — E1169 Type 2 diabetes mellitus with other specified complication: Secondary | ICD-10-CM | POA: Diagnosis not present

## 2018-05-04 DIAGNOSIS — Z0000 Encounter for general adult medical examination without abnormal findings: Secondary | ICD-10-CM | POA: Diagnosis not present

## 2018-05-04 DIAGNOSIS — E782 Mixed hyperlipidemia: Secondary | ICD-10-CM

## 2018-05-04 DIAGNOSIS — Z794 Long term (current) use of insulin: Secondary | ICD-10-CM

## 2018-05-04 NOTE — Patient Instructions (Signed)
You are doing well today Sugars were a bit higher A1c 7.8% Start monitoring more closely, return in 3 months for diabetes f/u visit.  Health Maintenance, Male A healthy lifestyle and preventive care is important for your health and wellness. Ask your health care provider about what schedule of regular examinations is right for you. What should I know about weight and diet? Eat a Healthy Diet  Eat plenty of vegetables, fruits, whole grains, low-fat dairy products, and lean protein.  Do not eat a lot of foods high in solid fats, added sugars, or salt.  Maintain a Healthy Weight Regular exercise can help you achieve or maintain a healthy weight. You should:  Do at least 150 minutes of exercise each week. The exercise should increase your heart rate and make you sweat (moderate-intensity exercise).  Do strength-training exercises at least twice a week. Watch Your Levels of Cholesterol and Blood Lipids  Have your blood tested for lipids and cholesterol every 5 years starting at 39 years of age. If you are at high risk for heart disease, you should start having your blood tested when you are 39 years old. You may need to have your cholesterol levels checked more often if: ? Your lipid or cholesterol levels are high. ? You are older than 39 years of age. ? You are at high risk for heart disease. What should I know about cancer screening? Many types of cancers can be detected early and may often be prevented. Lung Cancer  You should be screened every year for lung cancer if: ? You are a current smoker who has smoked for at least 30 years. ? You are a former smoker who has quit within the past 15 years.  Talk to your health care provider about your screening options, when you should start screening, and how often you should be screened. Colorectal Cancer  Routine colorectal cancer screening usually begins at 39 years of age and should be repeated every 5-10 years until you are 39 years  old. You may need to be screened more often if early forms of precancerous polyps or small growths are found. Your health care provider may recommend screening at an earlier age if you have risk factors for colon cancer.  Your health care provider may recommend using home test kits to check for hidden blood in the stool.  A small camera at the end of a tube can be used to examine your colon (sigmoidoscopy or colonoscopy). This checks for the earliest forms of colorectal cancer. Prostate and Testicular Cancer  Depending on your age and overall health, your health care provider may do certain tests to screen for prostate and testicular cancer.  Talk to your health care provider about any symptoms or concerns you have about testicular or prostate cancer. Skin Cancer  Check your skin from head to toe regularly.  Tell your health care provider about any new moles or changes in moles, especially if: ? There is a change in a mole's size, shape, or color. ? You have a mole that is larger than a pencil eraser.  Always use sunscreen. Apply sunscreen liberally and repeat throughout the day.  Protect yourself by wearing long sleeves, pants, a wide-brimmed hat, and sunglasses when outside. What should I know about heart disease, diabetes, and high blood pressure?  If you are 81-25 years of age, have your blood pressure checked every 3-5 years. If you are 70 years of age or older, have your blood pressure checked every year.  You should have your blood pressure measured twice-once when you are at a hospital or clinic, and once when you are not at a hospital or clinic. Record the average of the two measurements. To check your blood pressure when you are not at a hospital or clinic, you can use: ? An automated blood pressure machine at a pharmacy. ? A home blood pressure monitor.  Talk to your health care provider about your target blood pressure.  If you are between 79-14 years old, ask your health care  provider if you should take aspirin to prevent heart disease.  Have regular diabetes screenings by checking your fasting blood sugar level. ? If you are at a normal weight and have a low risk for diabetes, have this test once every three years after the age of 58. ? If you are overweight and have a high risk for diabetes, consider being tested at a younger age or more often.  A one-time screening for abdominal aortic aneurysm (AAA) by ultrasound is recommended for men aged 65-75 years who are current or former smokers. What should I know about preventing infection? Hepatitis B If you have a higher risk for hepatitis B, you should be screened for this virus. Talk with your health care provider to find out if you are at risk for hepatitis B infection. Hepatitis C Blood testing is recommended for:  Everyone born from [birth year] through 1966.  Anyone with known risk factors for hepatitis C. Sexually Transmitted Diseases (STDs)  You should be screened each year for STDs including gonorrhea and chlamydia if: ? You are sexually active and are younger than 39 years of age. ? You are older than 39 years of age and your health care provider tells you that you are at risk for this type of infection. ? Your sexual activity has changed since you were last screened and you are at an increased risk for chlamydia or gonorrhea. Ask your health care provider if you are at risk.  Talk with your health care provider about whether you are at high risk of being infected with HIV. Your health care provider may recommend a prescription medicine to help prevent HIV infection. What else can I do?  Schedule regular health, dental, and eye exams.  Stay current with your vaccines (immunizations).  Do not use any tobacco products, such as cigarettes, chewing tobacco, and e-cigarettes. If you need help quitting, ask your health care provider.  Limit alcohol intake to no more than 2 drinks per day. One drink equals 12  ounces of beer, 5 ounces of wine, or 1 ounces of hard liquor.  Do not use street drugs.  Do not share needles.  Ask your health care provider for help if you need support or information about quitting drugs.  Tell your health care provider if you often feel depressed.  Tell your health care provider if you have ever been abused or do not feel safe at home. This information is not intended to replace advice given to you by your health care provider. Make sure you discuss any questions you have with your health care provider. Document Released: 10/09/2007 Document Revised: 12/10/2015 Document Reviewed: 01/14/2015 Elsevier Interactive Patient Education  2019 [employer].

## 2018-05-04 NOTE — Assessment & Plan Note (Signed)
Chronic, stable on atorvastatin. Lipid levels a bit worse this year - likely from increase in sugars.  The ASCVD Risk score Denman Devere DC Jr., et al., 2013) failed to calculate for the following reasons:   The 2013 ASCVD risk score is only valid for ages 97 to 66

## 2018-05-04 NOTE — Assessment & Plan Note (Signed)
Deteriorated. Has not been as regular with sugar checks. Will renew efforts at cardio exercise and reassess at 3 mo f/u visit. Does not see endo.

## 2018-05-04 NOTE — Assessment & Plan Note (Signed)
Preventative protocols reviewed and updated unless pt declined. Discussed healthy diet and lifestyle.  

## 2018-07-09 ENCOUNTER — Encounter: Payer: Self-pay | Admitting: Family Medicine

## 2018-07-09 DIAGNOSIS — E782 Mixed hyperlipidemia: Secondary | ICD-10-CM

## 2018-07-09 DIAGNOSIS — Z794 Long term (current) use of insulin: Principal | ICD-10-CM

## 2018-07-09 DIAGNOSIS — E1169 Type 2 diabetes mellitus with other specified complication: Secondary | ICD-10-CM

## 2018-07-13 MED ORDER — INSULIN ASPART 100 UNIT/ML FLEXPEN: 6.0000 [IU] | PEN_INJECTOR | Freq: Three times a day (TID) | SUBCUTANEOUS | 3 refills | Status: DC

## 2018-07-13 NOTE — Telephone Encounter (Signed)
E-scribed 90-day refill to [facility] per pt request. Notified pt via MyChart.

## 2018-07-14 ENCOUNTER — Other Ambulatory Visit: Payer: Self-pay

## 2018-07-14 MED ORDER — ONETOUCH DELICA LANCETS 33G MISC: 1.0000 | 0 refills | Status: DC

## 2018-07-14 MED ORDER — GLUCOSE BLOOD VI STRP: 1.0000 | ORAL_STRIP | 0 refills | Status: DC | PRN

## 2018-07-14 NOTE — Telephone Encounter (Signed)
E-scribed rx's 

## 2018-07-18 ENCOUNTER — Other Ambulatory Visit: Payer: Self-pay | Admitting: Family Medicine

## 2018-07-18 NOTE — Telephone Encounter (Addendum)
Dr. Reece Agar, will you help me figure out how much insulin the pt will need for 90-days?  The lancets and test strips have already been refilled.  I'll notify the pt.

## 2018-07-20 ENCOUNTER — Other Ambulatory Visit: Payer: Self-pay | Admitting: Family Medicine

## 2018-07-20 MED ORDER — INSULIN ASPART 100 UNIT/ML FLEXPEN: 20.0000 [IU] | PEN_INJECTOR | Freq: Three times a day (TID) | SUBCUTANEOUS | 3 refills | Status: DC

## 2018-07-20 NOTE — Addendum Note (Signed)
Addended by: Eustaquio Boyden on: 07/20/2018 08:36 AM   Modules accepted: Orders

## 2018-07-27 NOTE — Telephone Encounter (Addendum)
Javier Mccoy  plz set pt up for virtual visit with labs prior (doesn't have to fast). I believe he has appt 4/13 and would offer curbside lab draw.  Thanks!

## 2018-07-27 NOTE — Addendum Note (Signed)
Addended by: Eustaquio Boyden on: 07/27/2018 07:44 AM   Modules accepted: Orders

## 2018-07-28 ENCOUNTER — Other Ambulatory Visit: Payer: Self-pay

## 2018-07-28 ENCOUNTER — Other Ambulatory Visit (INDEPENDENT_AMBULATORY_CARE_PROVIDER_SITE_OTHER): Payer: BC Managed Care – PPO

## 2018-07-28 DIAGNOSIS — E782 Mixed hyperlipidemia: Secondary | ICD-10-CM

## 2018-07-28 DIAGNOSIS — E1169 Type 2 diabetes mellitus with other specified complication: Secondary | ICD-10-CM

## 2018-07-28 DIAGNOSIS — Z794 Long term (current) use of insulin: Secondary | ICD-10-CM

## 2018-07-28 LAB — BASIC METABOLIC PANEL
BUN: 23 mg/dL (ref 6–23)
CO2: 30 mEq/L (ref 19–32)
Calcium: 9 mg/dL (ref 8.4–10.5)
Chloride: 102 mEq/L (ref 96–112)
Creatinine, Ser: 0.95 mg/dL (ref 0.40–1.50)
GFR: 88.48 mL/min (ref >60.00)
Glucose, Bld: 106 mg/dL — ABNORMAL HIGH (ref 70–99)
Potassium: 3.5 mEq/L (ref 3.5–5.1)
Sodium: 139 mEq/L (ref 135–145)

## 2018-07-28 LAB — HEMOGLOBIN A1C: Hgb A1c MFr Bld: 7.5 % — ABNORMAL HIGH (ref 4.6–6.5)

## 2018-07-28 LAB — LDL CHOLESTEROL, DIRECT: Direct LDL: 81 mg/dL

## 2018-08-07 ENCOUNTER — Encounter: Payer: Self-pay | Admitting: Family Medicine

## 2018-08-07 ENCOUNTER — Ambulatory Visit (INDEPENDENT_AMBULATORY_CARE_PROVIDER_SITE_OTHER): Payer: BC Managed Care – PPO | Admitting: Family Medicine

## 2018-08-07 ENCOUNTER — Telehealth: Payer: Self-pay | Admitting: Family Medicine

## 2018-08-07 ENCOUNTER — Other Ambulatory Visit: Payer: Self-pay

## 2018-08-07 VITALS — HR 61 | Temp 97.3°F | Ht 71.5 in | Wt 200.0 lb

## 2018-08-07 DIAGNOSIS — E782 Mixed hyperlipidemia: Secondary | ICD-10-CM

## 2018-08-07 DIAGNOSIS — Z794 Long term (current) use of insulin: Secondary | ICD-10-CM

## 2018-08-07 DIAGNOSIS — E1169 Type 2 diabetes mellitus with other specified complication: Secondary | ICD-10-CM | POA: Diagnosis not present

## 2018-08-07 MED ORDER — CO Q10 100 MG PO CAPS: 1.0000 | ORAL_CAPSULE | Freq: Every day | ORAL | Status: DC

## 2018-08-07 MED ORDER — BENZONATATE 100 MG PO CAPS
100.0000 mg | ORAL_CAPSULE | Freq: Three times a day (TID) | ORAL | 0 refills | Status: DC | PRN
Start: 2018-08-07 — End: 2018-11-07

## 2018-08-07 MED ORDER — BASAGLAR KWIKPEN 100 UNIT/ML ~~LOC~~ SOPN: 65.0000 [IU] | PEN_INJECTOR | Freq: Every day | SUBCUTANEOUS | 0 refills | Status: DC

## 2018-08-07 NOTE — Telephone Encounter (Signed)
plz call pt to schedule 3 month DM follow up visit.

## 2018-08-07 NOTE — Assessment & Plan Note (Addendum)
Overall stable readings - however A1c above goal.  Doses levemir in PM, finds sugars highest right before dosing levemir - anticipate due to loss of effect of long acting insulin at end of 24 hour period. Will trial basaglar (pt checked on insurance formulary). Will send 1 mo supply, pt will contact me in 3-4 wks with readings. Pt agrees with plan.

## 2018-08-07 NOTE — Assessment & Plan Note (Addendum)
Continue atorvastatin.  Will trial coQ 10 for possible myalgias.  Update with effect.

## 2018-08-07 NOTE — Progress Notes (Signed)
Virtual visit completed through [employer]. Due to national recommendations of social distancing due to COVID 19, a virtual visit is felt to be most appropriate for this patient at this time.   Patient location: home Provider location: [place] at [facility], office If any vitals were documented, they were collected by patient at home unless specified below.    Pulse 61   Temp (!) 97.3 F (36.3 C) (Tympanic)   Ht 5' 11.5" (1.816 m)   Wt 200 lb (90.7 kg)   BMI 27.51 kg/m   BP Readings from Last 3 Encounters:  05/04/18 118/76  01/27/18 118/74  07/27/17 118/74    CC: DM f/u visit Subjective:    Patient ID: Javier Mccoy, male    DOB: Aug 11, 1980, 38 y.o.   MRN: [id number]  HPI: Javier Mccoy is a 38 y.o. male presenting on 08/07/2018 for Diabetes (3 mo f/u.)    DM - does regluarly regularly check sugars 100-220, checks twice daily fasting in am and after a meal. Compliant with antihyperglycemic regimen which includes: metformin 1000mg  bid, levemir 65u daily, novolog flexpen 04/09/16 units. Levemir at night time, notes highest cbg's are at bedtime. Denies low sugars or hypoglycemic symptoms. Denies paresthesias. Last diabetic eye exam overdue. Pneumovax: 2018. Prevnar: not yet due. Glucometer brand: one-touch reveal. DSME: previously completed. Previously on toujeo. Does not see endo anymore.  Lab Results  Component Value Date   HGBA1C 7.5 (H) 07/28/2018   Diabetic Foot Exam - Simple   No data filed     Lab Results  Component Value Date   MICROALBUR 3.9 (H) 05/01/2018     Notes increasing joint pains, myalgias while on statin. Asks about fish oil alternative.      Relevant past medical, surgical, family and social history reviewed and updated as indicated. Interim medical history since our last visit reviewed. Allergies and medications reviewed and updated. Outpatient Medications Prior to Visit  Medication Sig Dispense Refill  . atenolol (TENORMIN) 50 MG tablet TAKE 1  TABLET DAILY 90 tablet 3  . atorvastatin (LIPITOR) 40 MG tablet TAKE 1 TABLET DAILY 90 tablet 3  . cetirizine (ZYRTEC) 10 MG tablet Take 10 mg by mouth daily.    . diphenoxylate-atropine (LOMOTIL) 2.5-0.025 MG tablet Take 2 tablets by mouth 4 (four) times daily as needed for diarrhea or loose stools. 30 tablet 0  . glucose blood (ONETOUCH VERIO) test strip 1 each by Other route as needed for other. Use as instructed to check blood sugar once daily. 100 each 0  . ibuprofen (ADVIL,MOTRIN) 800 MG tablet Take 800 mg by mouth every 8 (eight) hours as needed.    . insulin aspart (NOVOLOG FLEXPEN) 100 UNIT/ML FlexPen Inject 20 Units into the skin 3 (three) times daily with meals. QS 3 months. (04/09/14) 60 mL 3  . Insulin Detemir (LEVEMIR FLEXPEN) 100 UNIT/ML Pen Inject 65 Units into the skin daily at 10 pm. QS 3 months 45 mL 3  . Insulin Pen Needle (PEN NEEDLES) 31G X 5 MM MISC Inject 1 pen 3 (three) times daily into the skin. 300 each 0  . metFORMIN (GLUCOPHAGE) 1000 MG tablet TAKE 1 TABLET TWICE A DAY  WITH MEALS 180 tablet 1  . nitroGLYCERIN (NITRODUR - DOSED IN MG/24 HR) 0.2 mg/hr patch Apply 1/4 of a patch to the affected area and change every 24 hours (re: tendinopathy) 30 patch 4  . ondansetron (ZOFRAN) 4 MG tablet Take 1 tablet (4 mg total) by mouth every 6 (  six) hours. (Patient taking differently: Take 4 mg by mouth every 6 (six) hours. As needed) 30 tablet 0  . OneTouch Delica Lancets 33G MISC 1 each by Does not apply route as directed. Use as instructed to check blood sugar once daily 100 each 0  . benzonatate (TESSALON) 100 MG capsule Take 1 capsule (100 mg total) by mouth 3 (three) times daily as needed for cough. 40 capsule 0   No facility-administered medications prior to visit.      Per HPI unless specifically indicated in ROS section below Review of Systems Objective:    Pulse 61   Temp (!) 97.3 F (36.3 C) (Tympanic)   Ht 5' 11.5" (1.816 m)   Wt 200 lb (90.7 kg)   BMI 27.51  kg/m   Wt Readings from Last 3 Encounters:  08/07/18 200 lb (90.7 kg)  05/04/18 198 lb 4 oz (89.9 kg)  07/27/17 191 lb (86.6 kg)     Physical exam: Gen: alert, NAD, not ill appearing Pulm: speaks in complete sentences without increased work of breathing Psych: normal mood, normal thought content      Results for orders placed or performed in visit on 07/28/18  LDL Cholesterol, Direct  Result Value Ref Range   Direct LDL 81.0 mg/dL  Hemoglobin A2Z  Result Value Ref Range   Hgb A1c MFr Bld 7.5 (H) 4.6 - 6.5 %  Basic metabolic panel  Result Value Ref Range   Sodium 139 135 - 145 mEq/L   Potassium 3.5 3.5 - 5.1 mEq/L   Chloride 102 96 - 112 mEq/L   CO2 30 19 - 32 mEq/L   Glucose, Bld 106 (H) 70 - 99 mg/dL   BUN 23 6 - 23 mg/dL   Creatinine, Ser 3.08 0.40 - 1.50 mg/dL   Calcium 9.0 8.4 - 65.7 mg/dL   GFR 84.69 >62.95 mL/min   Assessment & Plan:  Over 25 minutes were spent face-to-face with the patient during this encounter and >50% of that time was spent on counseling and coordination of care  Problem List Items Addressed This Visit    Type 2 diabetes mellitus with other specified complication (HCC) - Primary    Overall stable readings - however A1c above goal.  Doses levemir in PM, finds sugars highest right before dosing levemir - anticipate due to loss of effect of long acting insulin at end of 24 hour period. Will trial basaglar (pt checked on insurance formulary). Will send 1 mo supply, pt will contact me in 3-4 wks with readings. Pt agrees with plan.       Relevant Medications   Insulin Glargine (BASAGLAR KWIKPEN) 100 UNIT/ML SOPN   Mixed hyperlipidemia    Continue atorvastatin.  Will trial coQ 10 for possible myalgias.  Update with effect.           Meds ordered this encounter  Medications  . Insulin Glargine (BASAGLAR KWIKPEN) 100 UNIT/ML SOPN    Sig: Inject 0.65 mLs (65 Units total) into the skin daily.    Dispense:  10 pen    Refill:  0  . benzonatate  (TESSALON) 100 MG capsule    Sig: Take 1 capsule (100 mg total) by mouth 3 (three) times daily as needed for cough.    Dispense:  40 capsule    Refill:  0  . Coenzyme Q10 (CO Q10) 100 MG CAPS    Sig: Take 1 capsule by mouth daily.    Dispense:  30 each   No orders  of the defined types were placed in this encounter.   Follow up plan: No follow-ups on file.  Eustaquio Boyden, MD

## 2018-08-08 NOTE — Telephone Encounter (Signed)
Pt scheduled 11/07/2018 @ 8am

## 2018-08-15 ENCOUNTER — Encounter: Payer: Self-pay | Admitting: Family Medicine

## 2018-08-15 MED ORDER — MONTELUKAST SODIUM 10 MG PO TABS: 10.0000 mg | ORAL_TABLET | Freq: Every day | ORAL | 3 refills | Status: DC

## 2018-09-16 ENCOUNTER — Other Ambulatory Visit: Payer: Self-pay | Admitting: Family Medicine

## 2018-10-14 ENCOUNTER — Other Ambulatory Visit: Payer: Self-pay | Admitting: Family Medicine

## 2018-11-07 ENCOUNTER — Other Ambulatory Visit: Payer: Self-pay

## 2018-11-07 ENCOUNTER — Telehealth (INDEPENDENT_AMBULATORY_CARE_PROVIDER_SITE_OTHER): Payer: BC Managed Care – PPO | Admitting: Family Medicine

## 2018-11-07 ENCOUNTER — Encounter: Payer: Self-pay | Admitting: Family Medicine

## 2018-11-07 VITALS — HR 75 | Temp 97.3°F | Ht 71.5 in | Wt 205.0 lb

## 2018-11-07 DIAGNOSIS — E782 Mixed hyperlipidemia: Secondary | ICD-10-CM

## 2018-11-07 DIAGNOSIS — Z794 Long term (current) use of insulin: Secondary | ICD-10-CM | POA: Diagnosis not present

## 2018-11-07 DIAGNOSIS — E1169 Type 2 diabetes mellitus with other specified complication: Secondary | ICD-10-CM

## 2018-11-07 MED ORDER — BASAGLAR KWIKPEN 100 UNIT/ML ~~LOC~~ SOPN: 65.0000 [IU] | PEN_INJECTOR | Freq: Every day | SUBCUTANEOUS | 3 refills | Status: DC

## 2018-11-07 NOTE — Progress Notes (Signed)
Virtual visit completed through [place]. Due to national recommendations of social distancing due to COVID-19, a virtual visit is felt to be most appropriate for this patient at this time. Reviewed limitations of a virtual visit.   Patient location: home Provider location: [place] at [facility], office If any vitals were documented, they were collected by patient at home unless specified below.    Pulse 75   Temp (!) 97.3 F (36.3 C)   Ht 5' 11.5" (1.816 m)   Wt 205 lb (93 kg)   BMI 28.19 kg/m    CC: 76mo DM f/u visit Subjective:    Patient ID: Javier Mccoy, male    DOB: [birth year], 38 y.o.   MRN: [id number]  HPI: Reason Helzer Mccoy is a 38 y.o. male presenting on 11/07/2018 for Follow-up (3 mo f/u. )   Ongoing chest congestion when he first starts running. Singulair didn't help much with allergic symptoms - he has stopped.   DM - does regularly check sugars fasting low 100s, highest cbg 210 (may have not taken insulin that night). Compliant with antihyperglycemic regimen which includes: metformin 1000mg  bid, basaglar 65u daily, novolog flexpen 04/09/16 u. Last visit we switched from levemir to basaglar due to concern over lack of 24 hour coverage of basal insulin. Denies low sugars or hypoglycemic symptoms. Denies paresthesias. Last diabetic eye exam: overdue - has been scheduled. Pneumovax: 04/2016. Prevnar: not due. Glucometer brand: one-touch reveal. DSME: completed [place].  Lab Results  Component Value Date   HGBA1C 7.5 (H) 07/28/2018   Diabetic Foot Exam - Simple   No data filed     Lab Results  Component Value Date   MICROALBUR 3.9 (H) 05/01/2018         Relevant past medical, surgical, family and social history reviewed and updated as indicated. Interim medical history since our last visit reviewed. Allergies and medications reviewed and updated. Outpatient Medications Prior to Visit  Medication Sig Dispense Refill  . atenolol (TENORMIN) 50 MG tablet TAKE 1 TABLET  DAILY 90 tablet 3  . atorvastatin (LIPITOR) 40 MG tablet TAKE 1 TABLET DAILY 90 tablet 3  . cetirizine (ZYRTEC) 10 MG tablet Take 10 mg by mouth daily.    . Coenzyme Q10 (CO Q10) 100 MG CAPS Take 1 capsule by mouth daily. 30 each   . diphenoxylate-atropine (LOMOTIL) 2.5-0.025 MG tablet Take 2 tablets by mouth 4 (four) times daily as needed for diarrhea or loose stools. 30 tablet 0  . glucose blood (ONETOUCH VERIO) test strip 1 each by Other route as needed for other. Use as instructed to check blood sugar once daily. 100 each 0  . ibuprofen (ADVIL,MOTRIN) 800 MG tablet Take 800 mg by mouth every 8 (eight) hours as needed.    . insulin aspart (NOVOLOG FLEXPEN) 100 UNIT/ML FlexPen Inject 20 Units into the skin 3 (three) times daily with meals. QS 3 months. (04/09/14) 60 mL 3  . Insulin Pen Needle (PEN NEEDLES) 31G X 5 MM MISC Inject 1 pen 3 (three) times daily into the skin. 300 each 0  . metFORMIN (GLUCOPHAGE) 1000 MG tablet TAKE 1 TABLET TWICE A DAY  WITH MEALS 180 tablet 1  . nitroGLYCERIN (NITRODUR - DOSED IN MG/24 HR) 0.2 mg/hr patch Apply 1/4 of a patch to the affected area and change every 24 hours (re: tendinopathy) 30 patch 4  . ondansetron (ZOFRAN) 4 MG tablet Take 1 tablet (4 mg total) by mouth every 6 (six) hours. (Patient taking differently: Take  4 mg by mouth every 6 (six) hours. As needed) 30 tablet 0  . OneTouch Delica Lancets 33G MISC 1 each by Does not apply route as directed. Use as instructed to check blood sugar once daily 100 each 0  . Insulin Detemir (LEVEMIR FLEXPEN) 100 UNIT/ML Pen Inject 65 Units into the skin daily at 10 pm. QS 3 months 45 mL 3  . Insulin Glargine (BASAGLAR KWIKPEN) 100 UNIT/ML SOPN INJECT 65 UNITS TOTAL INTO THE SKIN DAILY. 10 pen 0  . montelukast (SINGULAIR) 10 MG tablet Take 1 tablet (10 mg total) by mouth at bedtime. 30 tablet 3  . benzonatate (TESSALON) 100 MG capsule Take 1 capsule (100 mg total) by mouth 3 (three) times daily as needed for cough. 40  capsule 0   No facility-administered medications prior to visit.      Per HPI unless specifically indicated in ROS section below Review of Systems Objective:    Pulse 75   Temp (!) 97.3 F (36.3 C)   Ht 5' 11.5" (1.816 m)   Wt 205 lb (93 kg)   BMI 28.19 kg/m   Wt Readings from Last 3 Encounters:  11/07/18 205 lb (93 kg)  08/07/18 200 lb (90.7 kg)  05/04/18 198 lb 4 oz (89.9 kg)     Physical exam: Gen: alert, NAD, not ill appearing Pulm: speaks in complete sentences without increased work of breathing Psych: normal mood, normal thought content      Results for orders placed or performed in visit on 07/28/18  LDL Cholesterol, Direct  Result Value Ref Range   Direct LDL 81.0 mg/dL  Hemoglobin [id number]  Result Value Ref Range   Hgb A1c MFr Bld 7.5 (H) 4.6 - 6.5 %  Basic metabolic panel  Result Value Ref Range   Sodium 139 135 - 145 mEq/L   Potassium 3.5 3.5 - 5.1 mEq/L   Chloride 102 96 - 112 mEq/L   CO2 30 19 - 32 mEq/L   Glucose, Bld 106 (H) 70 - 99 mg/dL   BUN 23 6 - 23 mg/dL   Creatinine, Ser 8.110.95 0.40 - 1.50 mg/dL   Calcium 9.0 8.4 - 91.410.5 mg/dL   GFR 78.2988.48 >56.21>60.00 mL/min   Assessment & Plan:   Problem List Items Addressed This Visit    Type 2 diabetes mellitus with other specified complication (HCC) - Primary    Chronic, improved readings with switch to basaglar. Continue current regimen. Refilled to mail order. He will come in tomorrow for POC A1c.       Relevant Medications   Insulin Glargine (BASAGLAR KWIKPEN) 100 UNIT/ML SOPN   Mixed hyperlipidemia    Continues atorvastatin. He has stopped fish oil. Takes PRN coQ10.           Meds ordered this encounter  Medications  . DISCONTD: Insulin Glargine (BASAGLAR KWIKPEN) 100 UNIT/ML SOPN    Sig: Inject 0.65 mLs (65 Units total) into the skin at bedtime.    Dispense:  25 pen    Refill:  3  . Insulin Glargine (BASAGLAR KWIKPEN) 100 UNIT/ML SOPN    Sig: Inject 0.65 mLs (65 Units total) into the skin at  bedtime. QS 3 months    Dispense:  20 pen    Refill:  3   No orders of the defined types were placed in this encounter.   I discussed the assessment and treatment plan with the patient. The patient was provided an opportunity to ask questions and all were answered. The patient  agreed with the plan and demonstrated an understanding of the instructions. The patient was advised to call back or seek an in-person evaluation if the symptoms worsen or if the condition fails to improve as anticipated.  Follow up plan: No follow-ups on file.  Ria Bush, MD

## 2018-11-07 NOTE — Assessment & Plan Note (Signed)
Chronic, improved readings with switch to basaglar. Continue current regimen. Refilled to mail order. He will come in tomorrow for POC A1c.

## 2018-11-07 NOTE — Assessment & Plan Note (Signed)
Continues atorvastatin. He has stopped fish oil. Takes PRN coQ10.

## 2018-11-08 ENCOUNTER — Other Ambulatory Visit: Payer: Self-pay

## 2018-11-08 ENCOUNTER — Other Ambulatory Visit (INDEPENDENT_AMBULATORY_CARE_PROVIDER_SITE_OTHER): Payer: BC Managed Care – PPO

## 2018-11-08 DIAGNOSIS — Z794 Long term (current) use of insulin: Secondary | ICD-10-CM

## 2018-11-08 DIAGNOSIS — E1169 Type 2 diabetes mellitus with other specified complication: Secondary | ICD-10-CM | POA: Diagnosis not present

## 2018-11-08 LAB — POCT GLYCOSYLATED HEMOGLOBIN (HGB A1C): Hemoglobin A1C: 6.9 % — AB (ref 4.0–5.6)

## 2019-02-07 IMAGING — DX DG CHEST 2V
2 series · 2 of 2 positions shown · non-contrast
Comparison: None.

CLINICAL DATA: Cough. Right lower lobe rales. Shortness of breath.

EXAM:
CHEST - 2 VIEW

[chest pa]
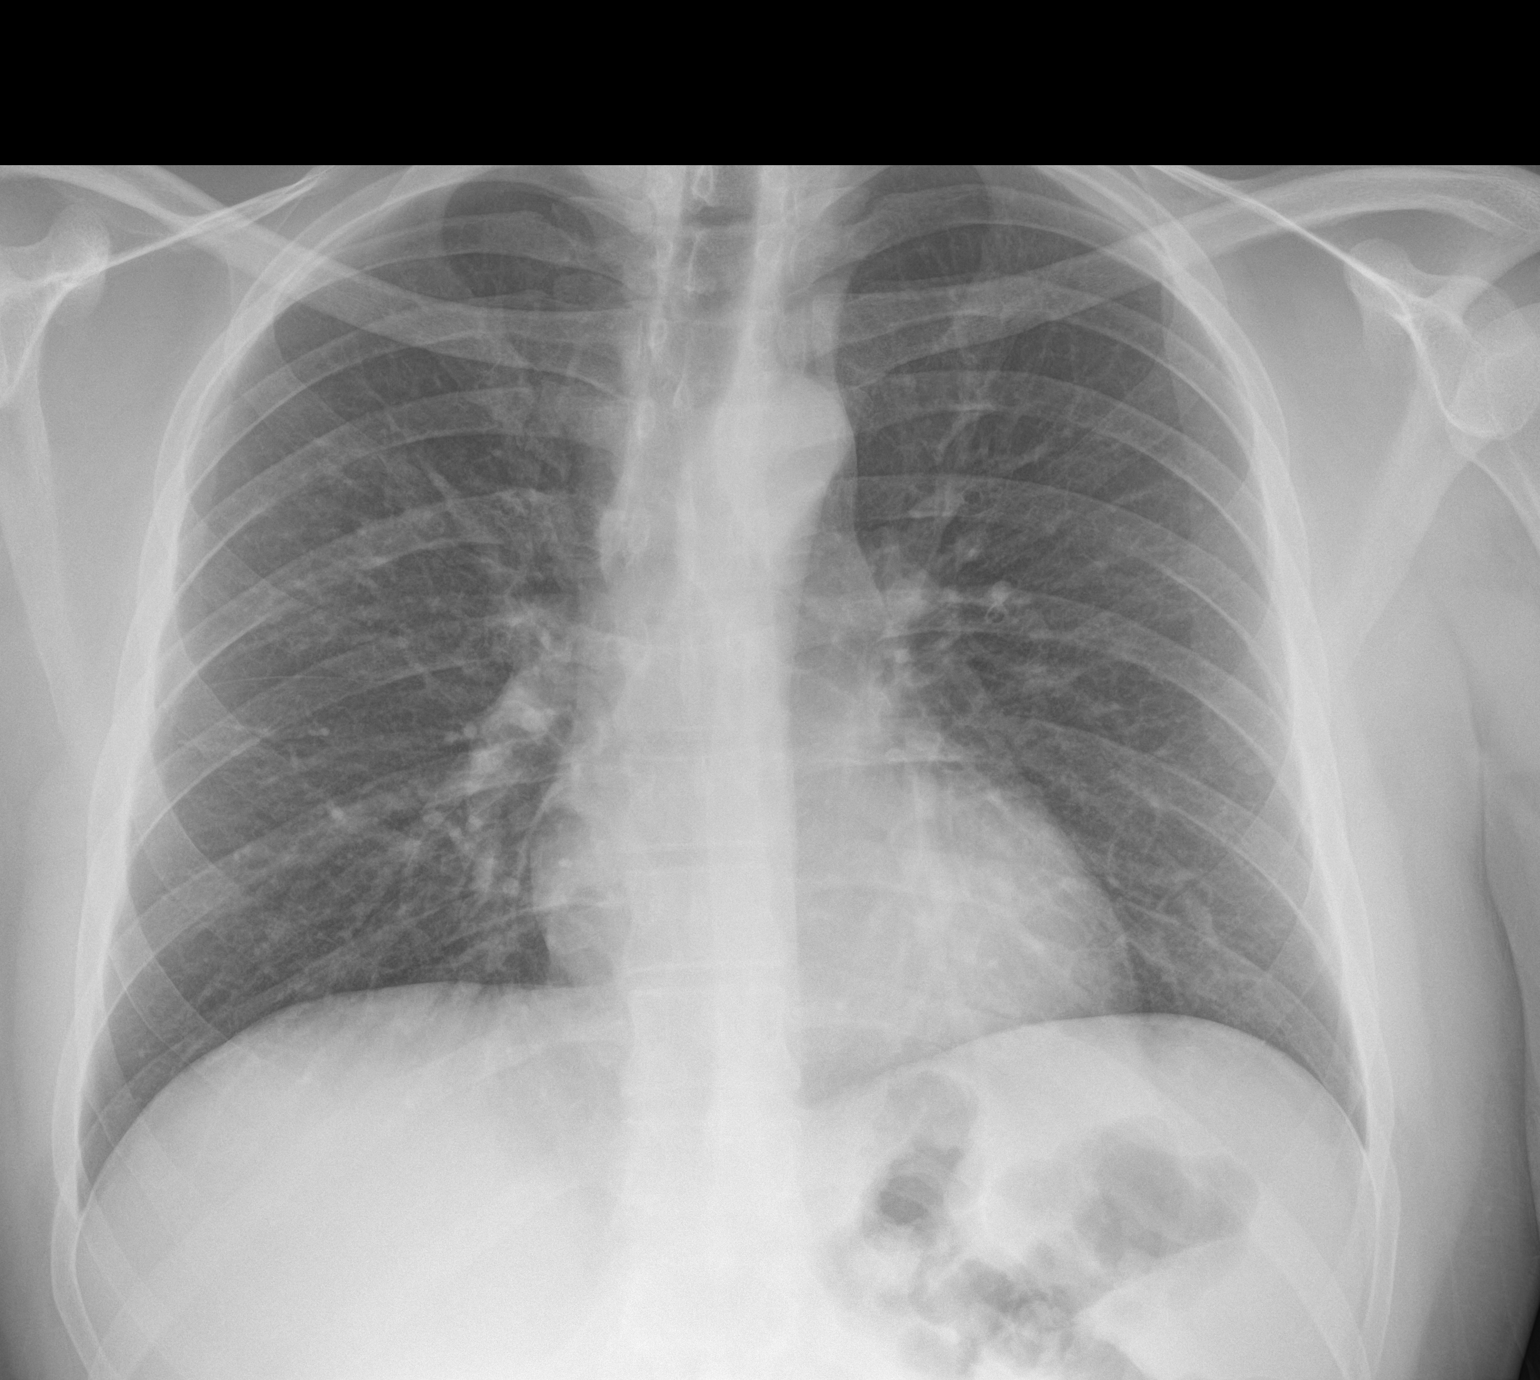

[chest lat]
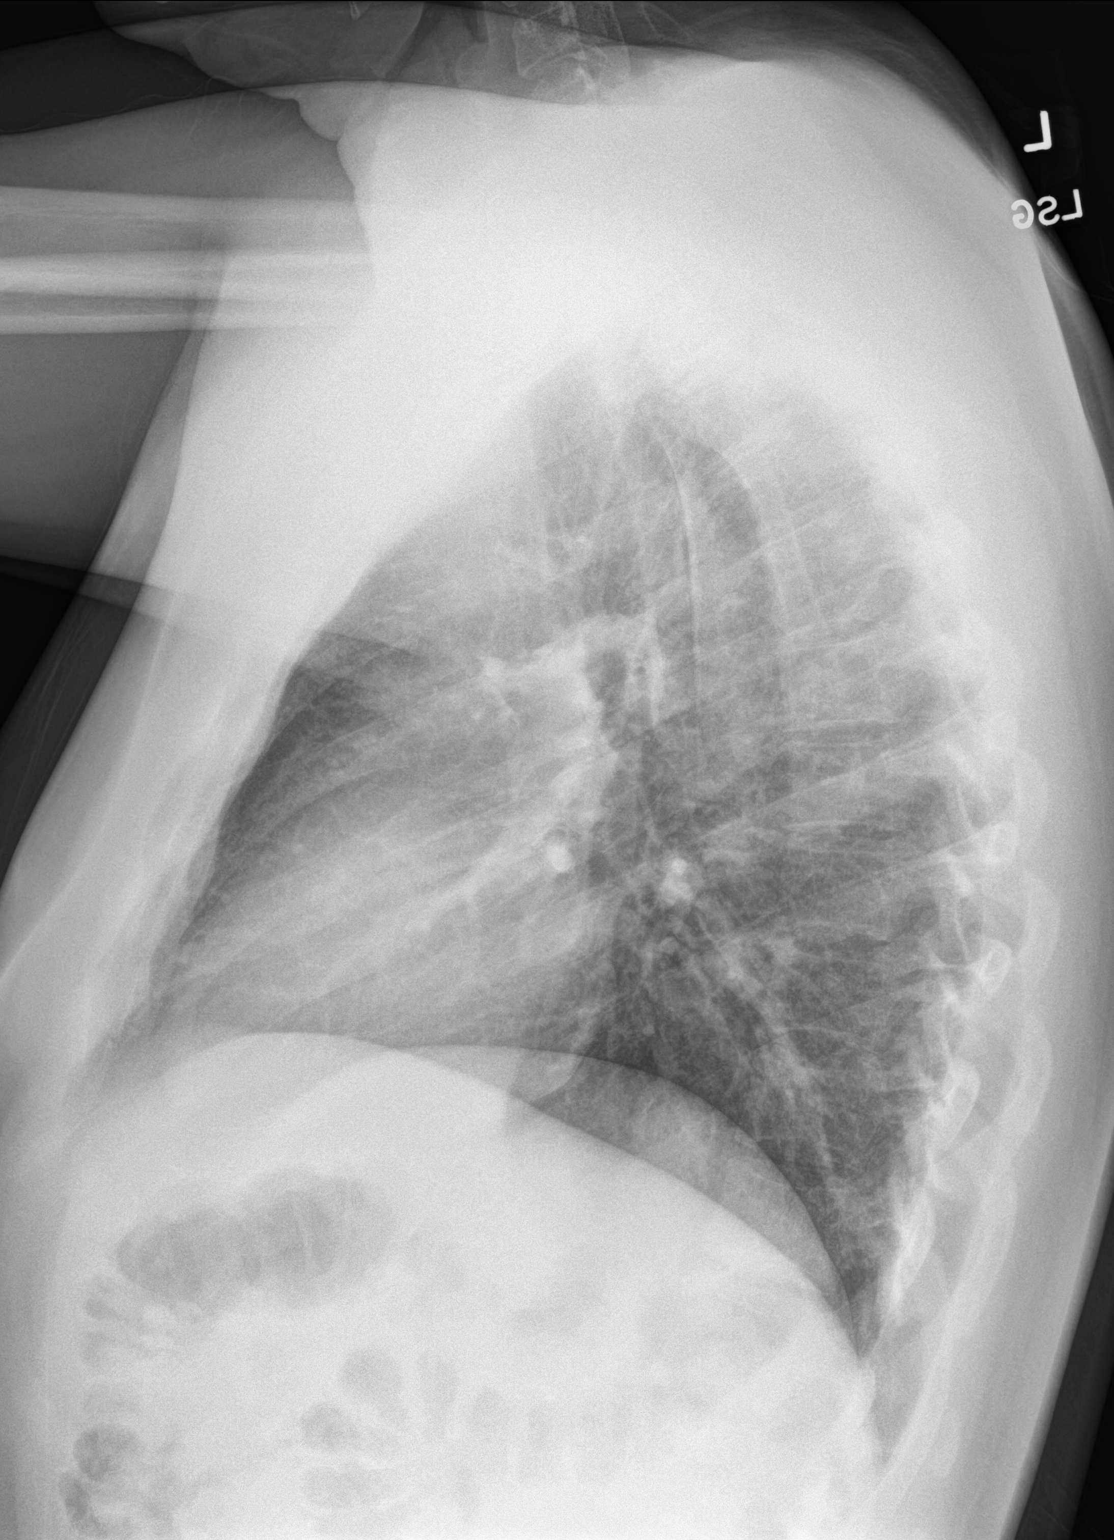

[2 of 2 positions shown; findings below may reference images not displayed]

FINDINGS: The cardiomediastinal silhouette is within normal limits. Subtle
infiltrate is questioned in the right mid lung. The left lung is
clear. No pleural effusion or pneumothorax is identified. No acute
osseous abnormality is seen.
IMPRESSION: Questionable right midlung infiltrate which could reflect early
pneumonia.

## 2019-04-02 ENCOUNTER — Other Ambulatory Visit: Payer: Self-pay | Admitting: Family Medicine

## 2019-05-15 ENCOUNTER — Other Ambulatory Visit: Payer: Self-pay | Admitting: Family Medicine

## 2019-05-15 DIAGNOSIS — Z794 Long term (current) use of insulin: Secondary | ICD-10-CM

## 2019-05-15 DIAGNOSIS — E782 Mixed hyperlipidemia: Secondary | ICD-10-CM

## 2019-05-15 DIAGNOSIS — E1169 Type 2 diabetes mellitus with other specified complication: Secondary | ICD-10-CM

## 2019-05-17 ENCOUNTER — Other Ambulatory Visit: Payer: Self-pay | Admitting: Family Medicine

## 2019-05-17 ENCOUNTER — Other Ambulatory Visit (INDEPENDENT_AMBULATORY_CARE_PROVIDER_SITE_OTHER): Payer: BC Managed Care – PPO

## 2019-05-17 ENCOUNTER — Other Ambulatory Visit: Payer: Self-pay

## 2019-05-17 DIAGNOSIS — E1169 Type 2 diabetes mellitus with other specified complication: Secondary | ICD-10-CM

## 2019-05-17 DIAGNOSIS — Z794 Long term (current) use of insulin: Secondary | ICD-10-CM | POA: Diagnosis not present

## 2019-05-17 DIAGNOSIS — E782 Mixed hyperlipidemia: Secondary | ICD-10-CM | POA: Diagnosis not present

## 2019-05-17 LAB — LIPID PANEL
Cholesterol: 169 mg/dL (ref 0–200)
HDL: 42.8 mg/dL (ref >39.00)
LDL Cholesterol: 100 mg/dL — ABNORMAL HIGH (ref 0–99)
NonHDL: 126.45
Total CHOL/HDL Ratio: 4
Triglycerides: 134 mg/dL (ref 0.0–149.0)
VLDL: 26.8 mg/dL (ref 0.0–40.0)

## 2019-05-17 LAB — COMPREHENSIVE METABOLIC PANEL
ALT: 15 U/L (ref 0–53)
AST: 12 U/L (ref 0–37)
Albumin: 4.2 g/dL (ref 3.5–5.2)
Alkaline Phosphatase: 73 U/L (ref 39–117)
BUN: 18 mg/dL (ref 6–23)
CO2: 31 mEq/L (ref 19–32)
Calcium: 9.2 mg/dL (ref 8.4–10.5)
Chloride: 104 mEq/L (ref 96–112)
Creatinine, Ser: 0.95 mg/dL (ref 0.40–1.50)
GFR: 88.11 mL/min (ref >60.00)
Glucose, Bld: 130 mg/dL — ABNORMAL HIGH (ref 70–99)
Potassium: 3.6 mEq/L (ref 3.5–5.1)
Sodium: 139 mEq/L (ref 135–145)
Total Bilirubin: 0.8 mg/dL (ref 0.2–1.2)
Total Protein: 6.4 g/dL (ref 6.0–8.3)

## 2019-05-17 LAB — MICROALBUMIN / CREATININE URINE RATIO
Creatinine,U: 380.9 mg/dL
Microalb Creat Ratio: 0.5 mg/g (ref 0.0–30.0)
Microalb, Ur: 2 mg/dL — ABNORMAL HIGH (ref 0.0–1.9)

## 2019-05-17 LAB — HEMOGLOBIN A1C: Hgb A1c MFr Bld: 8.3 % — ABNORMAL HIGH (ref 4.6–6.5)

## 2019-05-17 NOTE — Addendum Note (Signed)
Addended by: Aquilla Solian on: 05/17/2019 09:10 AM   Modules accepted: Orders

## 2019-05-24 ENCOUNTER — Ambulatory Visit (INDEPENDENT_AMBULATORY_CARE_PROVIDER_SITE_OTHER): Payer: BC Managed Care – PPO | Admitting: Family Medicine

## 2019-05-24 ENCOUNTER — Other Ambulatory Visit: Payer: Self-pay

## 2019-05-24 ENCOUNTER — Encounter: Payer: Self-pay | Admitting: Family Medicine

## 2019-05-24 VITALS — BP 116/70 | HR 71 | Temp 96.4°F | Ht 71.5 in | Wt 203.4 lb

## 2019-05-24 DIAGNOSIS — E785 Hyperlipidemia, unspecified: Secondary | ICD-10-CM | POA: Diagnosis not present

## 2019-05-24 DIAGNOSIS — E1169 Type 2 diabetes mellitus with other specified complication: Secondary | ICD-10-CM

## 2019-05-24 DIAGNOSIS — Z0000 Encounter for general adult medical examination without abnormal findings: Secondary | ICD-10-CM | POA: Diagnosis not present

## 2019-05-24 DIAGNOSIS — Z794 Long term (current) use of insulin: Secondary | ICD-10-CM | POA: Diagnosis not present

## 2019-05-24 MED ORDER — PEN NEEDLES 31G X 5 MM MISC: 1.0000 "pen " | Freq: Three times a day (TID) | 3 refills | Status: AC

## 2019-05-24 MED ORDER — METFORMIN HCL ER 750 MG PO TB24: 750.0000 mg | ORAL_TABLET | Freq: Two times a day (BID) | ORAL | 1 refills | Status: DC

## 2019-05-24 MED ORDER — ONETOUCH VERIO VI STRP: ORAL_STRIP | 3 refills | Status: DC

## 2019-05-24 NOTE — Assessment & Plan Note (Signed)
Chronic, deteriorated. Only taking metformin once daily due to GI upset if BID. Will change metformin to XR formulation hopeful for better GI tolerance. Continue insulin - discussed possibly splitting basal dose for better absorption.

## 2019-05-24 NOTE — Patient Instructions (Signed)
You are doing well today Work on physical activity routine.  Let's try extended release metformin 750mg  twice daily. We may consider splitting basaglar dosing in the future.  Return as needed or in 3 months for diabetes follow up visit.   Health Maintenance, Male Adopting a healthy lifestyle and getting preventive care are important in promoting health and wellness. Ask your health care provider about:  The right schedule for you to have regular tests and exams.  Things you can do on your own to prevent diseases and keep yourself healthy. What should I know about diet, weight, and exercise? Eat a healthy diet   Eat a diet that includes plenty of vegetables, fruits, low-fat dairy products, and lean protein.  Do not eat a lot of foods that are high in solid fats, added sugars, or sodium. Maintain a healthy weight Body mass index (BMI) is a measurement that can be used to identify possible weight problems. It estimates body fat based on height and weight. Your health care provider can help determine your BMI and help you achieve or maintain a healthy weight. Get regular exercise Get regular exercise. This is one of the most important things you can do for your health. Most adults should:  Exercise for at least 150 minutes each week. The exercise should increase your heart rate and make you sweat (moderate-intensity exercise).  Do strengthening exercises at least twice a week. This is in addition to the moderate-intensity exercise.  Spend less time sitting. Even light physical activity can be beneficial. Watch cholesterol and blood lipids Have your blood tested for lipids and cholesterol at 40 years of age, then have this test every 5 years. You may need to have your cholesterol levels checked more often if:  Your lipid or cholesterol levels are high.  You are older than 40 years of age.  You are at high risk for heart disease. What should I know about cancer screening? Many types  of cancers can be detected early and may often be prevented. Depending on your health history and family history, you may need to have cancer screening at various ages. This may include screening for:  Colorectal cancer.  Prostate cancer.  Skin cancer.  Lung cancer. What should I know about heart disease, diabetes, and high blood pressure? Blood pressure and heart disease  High blood pressure causes heart disease and increases the risk of stroke. This is more likely to develop in people who have high blood pressure readings, are of African descent, or are overweight.  Talk with your health care provider about your target blood pressure readings.  Have your blood pressure checked: ? Every 3-5 years if you are 63-35 years of age. ? Every year if you are 46 years old or older.  If you are between the ages of 60 and 53 and are a current or former smoker, ask your health care provider if you should have a one-time screening for abdominal aortic aneurysm (AAA). Diabetes Have regular diabetes screenings. This checks your fasting blood sugar level. Have the screening done:  Once every three years after age 48 if you are at a normal weight and have a low risk for diabetes.  More often and at a younger age if you are overweight or have a high risk for diabetes. What should I know about preventing infection? Hepatitis B If you have a higher risk for hepatitis B, you should be screened for this virus. Talk with your health care provider to find out  if you are at risk for hepatitis B infection. Hepatitis C Blood testing is recommended for:  Everyone born from [birth year] through 1966.  Anyone with known risk factors for hepatitis C. Sexually transmitted infections (STIs)  You should be screened each year for STIs, including gonorrhea and chlamydia, if: ? You are sexually active and are younger than 39 years of age. ? You are older than 40 years of age and your health care provider tells you that  you are at risk for this type of infection. ? Your sexual activity has changed since you were last screened, and you are at increased risk for chlamydia or gonorrhea. Ask your health care provider if you are at risk.  Ask your health care provider about whether you are at high risk for HIV. Your health care provider may recommend a prescription medicine to help prevent HIV infection. If you choose to take medicine to prevent HIV, you should first get tested for HIV. You should then be tested every 3 months for as long as you are taking the medicine. Follow these instructions at home: Lifestyle  Do not use any products that contain nicotine or tobacco, such as cigarettes, e-cigarettes, and chewing tobacco. If you need help quitting, ask your health care provider.  Do not use street drugs.  Do not share needles.  Ask your health care provider for help if you need support or information about quitting drugs. Alcohol use  Do not drink alcohol if your health care provider tells you not to drink.  If you drink alcohol: ? Limit how much you have to 0-2 drinks a day. ? Be aware of how much alcohol is in your drink. In the [place] one drink equals one 12 oz bottle of beer (355 mL), one 5 oz glass of wine (148 mL), or one 1 oz glass of hard liquor (44 mL). General instructions  Schedule regular health, dental, and eye exams.  Stay current with your vaccines.  Tell your health care provider if: ? You often feel depressed. ? You have ever been abused or do not feel safe at home. Summary  Adopting a healthy lifestyle and getting preventive care are important in promoting health and wellness.  Follow your health care provider's instructions about healthy diet, exercising, and getting tested or screened for diseases.  Follow your health care provider's instructions on monitoring your cholesterol and blood pressure. This information is not intended to replace advice given to you by your health  care provider. Make sure you discuss any questions you have with your health care provider. Document Revised: 04/05/2018 Document Reviewed: 04/05/2018 Elsevier Patient Education  2020 [employer].

## 2019-05-24 NOTE — Progress Notes (Signed)
This visit was conducted in person.  BP 116/70   Pulse 71   Temp (!) 96.4 F (35.8 C)   Ht 5' 11.5" (1.816 m)   Wt 203 lb 6.4 oz (92.3 kg)   SpO2 97%   BMI 27.97 kg/m    CC: CPE Subjective:    Patient ID: Javier Mccoy, male    DOB: 03/06/1981, 39 y.o.   MRN: [id number]  HPI: Javier Mccoy is a 39 y.o. male presenting on 05/24/2019 for Annual Exam and Medication Refill (pen needles)   DM - now on basaglar 65u daily, novolog flexpen 12/15/15u and metformin 1000mg  bid (has mainly been taking 1 at night). Attributes increased A1c to more sedentary lifestyle.  Lab Results  Component Value Date   HGBA1C 8.3 (H) 05/17/2019     Preventative: Flu shot yearly Pneumovax 04/2016 Td 2014 Seat belt use discussed Sunscreen use discussed. No changing moles on skin. Cyst on back since college after paintball hit him Non smoker Alcohol - 2-3 wine glasses/wk Dentist q6 mo Eye doctor yearly (due)  Lives with husband Javier Mccoy) and 2 children  Occ: college Office manager Activity:Enjoyscycle, run and kayak.  Diet: good water, fruits/vegetables daily      Relevant past medical, surgical, family and social history reviewed and updated as indicated. Interim medical history since our last visit reviewed. Allergies and medications reviewed and updated. Outpatient Medications Prior to Visit  Medication Sig Dispense Refill  . atenolol (TENORMIN) 50 MG tablet TAKE 1 TABLET DAILY 90 tablet 0  . atorvastatin (LIPITOR) 40 MG tablet TAKE 1 TABLET DAILY 90 tablet 3  . cetirizine (ZYRTEC) 10 MG tablet Take 10 mg by mouth daily.    . Coenzyme Q10 (CO Q10) 100 MG CAPS Take 1 capsule by mouth daily. 30 each   . diphenoxylate-atropine (LOMOTIL) 2.5-0.025 MG tablet Take 2 tablets by mouth 4 (four) times daily as needed for diarrhea or loose stools. 30 tablet 0  . ibuprofen (ADVIL,MOTRIN) 800 MG tablet Take 800 mg by mouth every 8 (eight) hours as needed.    . insulin aspart (NOVOLOG  FLEXPEN) 100 UNIT/ML FlexPen Inject 20 Units into the skin 3 (three) times daily with meals. QS 3 months. (04/09/14) 60 mL 3  . Insulin Glargine (BASAGLAR KWIKPEN) 100 UNIT/ML SOPN Inject 0.65 mLs (65 Units total) into the skin at bedtime. QS 3 months 20 pen 3  . nitroGLYCERIN (NITRODUR - DOSED IN MG/24 HR) 0.2 mg/hr patch Apply 1/4 of a patch to the affected area and change every 24 hours (re: tendinopathy) 30 patch 4  . ondansetron (ZOFRAN) 4 MG tablet Take 1 tablet (4 mg total) by mouth every 6 (six) hours. (Patient taking differently: Take 4 mg by mouth every 6 (six) hours. As needed) 30 tablet 0  . OneTouch Delica Lancets 62X MISC 1 each by Does not apply route as directed. Use as instructed to check blood sugar once daily 100 each 0  . Insulin Pen Needle (PEN NEEDLES) 31G X 5 MM MISC Inject 1 pen 3 (three) times daily into the skin. 300 each 0  . metFORMIN (GLUCOPHAGE) 1000 MG tablet TAKE 1 TABLET TWICE A DAY  WITH MEALS 180 tablet 1  . ONETOUCH VERIO test strip USE AS INSTRUCTED TO CHECK BLOOD SUGAR ONCE DAILY AS  NEEDED 100 strip 0   No facility-administered medications prior to visit.     Per HPI unless specifically indicated in ROS section below Review of Systems  Constitutional: Negative for activity  change, appetite change, chills, fatigue, fever and unexpected weight change.  HENT: Negative for hearing loss.   Eyes: Negative for visual disturbance.  Respiratory: Negative for cough, chest tightness, shortness of breath and wheezing.   Cardiovascular: Negative for chest pain, palpitations and leg swelling.  Gastrointestinal: Negative for abdominal distention, abdominal pain, blood in stool, constipation, diarrhea, nausea and vomiting.  Genitourinary: Negative for difficulty urinating and hematuria.  Musculoskeletal: Negative for arthralgias, myalgias and neck pain.  Skin: Negative for rash.  Neurological: Negative for dizziness, seizures, syncope and headaches.  Hematological:  Negative for adenopathy. Does not bruise/bleed easily.  Psychiatric/Behavioral: Negative for dysphoric mood. The patient is not nervous/anxious.    Objective:    BP 116/70   Pulse 71   Temp (!) 96.4 F (35.8 C)   Ht 5' 11.5" (1.816 m)   Wt 203 lb 6.4 oz (92.3 kg)   SpO2 97%   BMI 27.97 kg/m   Wt Readings from Last 3 Encounters:  05/24/19 203 lb 6.4 oz (92.3 kg)  11/07/18 205 lb (93 kg)  08/07/18 200 lb (90.7 kg)    Physical Exam Vitals and nursing note reviewed.  Constitutional:      General: He is not in acute distress.    Appearance: Normal appearance. He is well-developed. He is not ill-appearing.  HENT:     Head: Normocephalic and atraumatic.     Right Ear: Hearing, tympanic membrane, ear canal and external ear normal.     Left Ear: Hearing, tympanic membrane, ear canal and external ear normal.     Mouth/Throat:     Pharynx: Uvula midline.  Eyes:     General: No scleral icterus.    Extraocular Movements: Extraocular movements intact.     Conjunctiva/sclera: Conjunctivae normal.     Pupils: Pupils are equal, round, and reactive to light.  Cardiovascular:     Rate and Rhythm: Normal rate and regular rhythm.     Pulses: Normal pulses.          Radial pulses are 2+ on the right side and 2+ on the left side.     Heart sounds: Normal heart sounds. No murmur.  Pulmonary:     Effort: Pulmonary effort is normal. No respiratory distress.     Breath sounds: Normal breath sounds. No wheezing, rhonchi or rales.  Abdominal:     General: Abdomen is flat. Bowel sounds are normal. There is no distension.     Palpations: Abdomen is soft. There is no mass.     Tenderness: There is no abdominal tenderness. There is no guarding or rebound.     Hernia: No hernia is present.  Musculoskeletal:        General: Normal range of motion.     Cervical back: Normal range of motion and neck supple.     Right lower leg: No edema.     Left lower leg: No edema.  Lymphadenopathy:     Cervical:  No cervical adenopathy.  Skin:    General: Skin is warm and dry.     Findings: Lesion present. No rash.          Comments: Firm nodule to R mid back  Neurological:     General: No focal deficit present.     Mental Status: He is alert and oriented to person, place, and time.     Comments: CN grossly intact, station and gait intact  Psychiatric:        Mood and Affect: Mood normal.  Behavior: Behavior normal.        Thought Content: Thought content normal.        Judgment: Judgment normal.       Results for orders placed or performed in visit on 05/17/19  Comprehensive metabolic panel  Result Value Ref Range   Sodium 139 135 - 145 mEq/L   Potassium 3.6 3.5 - 5.1 mEq/L   Chloride 104 96 - 112 mEq/L   CO2 31 19 - 32 mEq/L   Glucose, Bld 130 (H) 70 - 99 mg/dL   BUN 18 6 - 23 mg/dL   Creatinine, Ser 9.48 0.40 - 1.50 mg/dL   Total Bilirubin 0.8 0.2 - 1.2 mg/dL   Alkaline Phosphatase 73 39 - 117 U/L   AST 12 0 - 37 U/L   ALT 15 0 - 53 U/L   Total Protein 6.4 6.0 - 8.3 g/dL   Albumin 4.2 3.5 - 5.2 g/dL   GFR 54.62 >70.35 mL/min   Calcium 9.2 8.4 - 10.5 mg/dL  Lipid panel  Result Value Ref Range   Cholesterol 169 0 - 200 mg/dL   Triglycerides 009.3 0.0 - 149.0 mg/dL   HDL 81.82 >99.37 mg/dL   VLDL 16.9 0.0 - 67.8 mg/dL   LDL Cholesterol 938 (H) 0 - 99 mg/dL   Total CHOL/HDL Ratio 4    NonHDL 126.45   Hemoglobin A1c  Result Value Ref Range   Hgb A1c MFr Bld 8.3 (H) 4.6 - 6.5 %  Microalbumin / creatinine urine ratio  Result Value Ref Range   Microalb, Ur 2.0 (H) 0.0 - 1.9 mg/dL   Creatinine,U 101.7 mg/dL   Microalb Creat Ratio 0.5 0.0 - 30.0 mg/g   Assessment & Plan:  This visit occurred during the SARS-CoV-2 public health emergency.  Safety protocols were in place, including screening questions prior to the visit, additional usage of staff PPE, and extensive cleaning of exam room while observing appropriate contact time as indicated for disinfecting solutions.    Problem List Items Addressed This Visit    Type 2 diabetes mellitus with other specified complication (HCC)    Chronic, deteriorated. Only taking metformin once daily due to GI upset if BID. Will change metformin to XR formulation hopeful for better GI tolerance. Continue insulin - discussed possibly splitting basal dose for better absorption.       Relevant Medications   metFORMIN (GLUCOPHAGE XR) 750 MG 24 hr tablet   Hyperlipidemia associated with type 2 diabetes mellitus (HCC)    Chronic, stable on atorvastatin - continue. The ASCVD Risk score Denman Antwuan DC Jr., et al., 2013) failed to calculate for the following reasons:   The 2013 ASCVD risk score is only valid for ages 46 to 13       Relevant Medications   metFORMIN (GLUCOPHAGE XR) 750 MG 24 hr tablet   Health maintenance examination - Primary    Preventative protocols reviewed and updated unless pt declined. Discussed healthy diet and lifestyle.  Encouraged increased physical activity for better sugar control.           Meds ordered this encounter  Medications  . metFORMIN (GLUCOPHAGE XR) 750 MG 24 hr tablet    Sig: Take 1 tablet (750 mg total) by mouth 2 (two) times daily.    Dispense:  180 tablet    Refill:  1    In place of IR metformin  . Insulin Pen Needle (PEN NEEDLES) 31G X 5 MM MISC    Sig: Inject 1 pen into  the skin 3 (three) times daily.    Dispense:  300 each    Refill:  3  . glucose blood (ONETOUCH VERIO) test strip    Sig: Use as instructed to check sugars three times daily    Dispense:  300 strip    Refill:  3   No orders of the defined types were placed in this encounter.   Patient instructions: You are doing well today Work on physical activity routine.  Let's try extended release metformin 750mg  twice daily. We may consider splitting basaglar dosing in the future.  Return as needed or in 3 months for diabetes follow up visit.   Follow up plan: Return in about 3 months (around 08/22/2019), or if  symptoms worsen or fail to improve, for follow up visit.  08/24/2019, MD

## 2019-05-24 NOTE — Assessment & Plan Note (Addendum)
Preventative protocols reviewed and updated unless pt declined. Discussed healthy diet and lifestyle.  Encouraged increased physical activity for better sugar control.

## 2019-05-24 NOTE — Assessment & Plan Note (Signed)
Chronic, stable on atorvastatin - continue The ASCVD Risk score (Goff DC Jr., et al., 2013) failed to calculate for the following reasons:   The 2013 ASCVD risk score is only valid for ages 40 to 79  

## 2019-06-30 ENCOUNTER — Ambulatory Visit: Payer: BC Managed Care – PPO | Attending: Internal Medicine

## 2019-06-30 DIAGNOSIS — Z23 Encounter for immunization: Secondary | ICD-10-CM | POA: Insufficient documentation

## 2019-06-30 NOTE — Progress Notes (Signed)
   Covid-19 Vaccination Clinic  Name:  Javier Mccoy    MRN: [id number] DOB: [birth year]  06/30/2019  Javier Mccoy was observed post Covid-19 immunization for 15 minutes without incident. He was provided with Vaccine Information Sheet and instruction to access the V-Safe system.   Javier Mccoy was instructed to call 911 with any severe reactions post vaccine: Marland Kitchen Difficulty breathing  . Swelling of face and throat  . A fast heartbeat  . A bad rash all over body  . Dizziness and weakness   Immunizations Administered    Name Date Dose VIS Date Route   Pfizer COVID-19 Vaccine 06/30/2019  4:51 PM 0.3 mL 04/06/2019 Intramuscular   Manufacturer: [employer], [employer]   Lot: [id number]   NDC: [address]-5

## 2019-07-21 ENCOUNTER — Ambulatory Visit: Payer: BC Managed Care – PPO | Attending: Internal Medicine

## 2019-07-21 DIAGNOSIS — Z23 Encounter for immunization: Secondary | ICD-10-CM

## 2019-07-21 NOTE — Progress Notes (Signed)
   Covid-19 Vaccination Clinic  Name:  Javier Mccoy    MRN: [id number] DOB: 1981/03/28  07/21/2019  Mr. Javier Mccoy was observed post Covid-19 immunization for 15 minutes without incident. He was provided with Vaccine Information Sheet and instruction to access the V-Safe system.   Mr. Javier Mccoy was instructed to call 911 with any severe reactions post vaccine: Marland Kitchen Difficulty breathing  . Swelling of face and throat  . A fast heartbeat  . A bad rash all over body  . Dizziness and weakness   Immunizations Administered    Name Date Dose VIS Date Route   Pfizer COVID-19 Vaccine 07/21/2019  8:43 AM 0.3 mL 04/06/2019 Intramuscular   Manufacturer: [employer], [employer]   Lot: [id number]   NDC: [address]-7

## 2019-07-31 ENCOUNTER — Ambulatory Visit: Payer: BC Managed Care – PPO

## 2019-08-23 ENCOUNTER — Encounter: Payer: Self-pay | Admitting: Family Medicine

## 2019-08-23 ENCOUNTER — Ambulatory Visit: Payer: BC Managed Care – PPO | Admitting: Family Medicine

## 2019-08-23 ENCOUNTER — Other Ambulatory Visit: Payer: Self-pay | Admitting: Family Medicine

## 2019-08-23 ENCOUNTER — Other Ambulatory Visit: Payer: Self-pay

## 2019-08-23 VITALS — BP 116/70 | HR 68 | Temp 97.8°F | Ht 71.5 in | Wt 207.4 lb

## 2019-08-23 DIAGNOSIS — E1169 Type 2 diabetes mellitus with other specified complication: Secondary | ICD-10-CM

## 2019-08-23 DIAGNOSIS — E785 Hyperlipidemia, unspecified: Secondary | ICD-10-CM

## 2019-08-23 DIAGNOSIS — Z794 Long term (current) use of insulin: Secondary | ICD-10-CM | POA: Diagnosis not present

## 2019-08-23 LAB — POCT GLYCOSYLATED HEMOGLOBIN (HGB A1C): Hemoglobin A1C: 7.7 % — AB (ref 4.0–5.6)

## 2019-08-23 MED ORDER — ATENOLOL 50 MG PO TABS: 50.0000 mg | ORAL_TABLET | Freq: Every day | ORAL | 3 refills | Status: DC

## 2019-08-23 MED ORDER — ATORVASTATIN CALCIUM 40 MG PO TABS: 40.0000 mg | ORAL_TABLET | Freq: Every day | ORAL | 3 refills | Status: DC

## 2019-08-23 NOTE — Assessment & Plan Note (Signed)
Chronic, improving. Ideally want A1c <7%.  Continue current regimen. Reviewed prolonged overnight fast likely contributing to impaired hyperglycemia. I suggested he add a small bedtime snack with protein ie yogurt, peanut butter crackers, etc and monitor effect on am sugars.  He does have hypoglycemic awareness at 94s.  Reviewed hypoglycemia management, 15-15 rule reviewed.  RTC 3-4 mo DM f/u visit

## 2019-08-23 NOTE — Patient Instructions (Addendum)
Continue current medicines.  You are doing well today. Try bedtime snack with protein to see effect on morning readings.  Return as needed or in 3-4 months for diabetes follow up.   The 15-15 rule for low sugars: If sugar reading below 70, have 15 grams of carbohydrate to raise your blood sugar and check it after 15 minutes. If it's Kracht below 70 mg/dL, have another serving. 15 grams of carbs may be: -Glucose tablets (see instructions) -Gel tube (see instructions) -4 ounces (1/2 cup) of juice or regular soda (not diet) -1 tablespoon of sugar, honey, or corn syrup -Hard candies, jellybeans or gumdrops--see food label for how many to consume  Repeat these steps until your blood sugar is at least 70 mg/dL. Once your blood sugar is back to normal, eat a meal or snack to make sure it doesn't lower again.

## 2019-08-23 NOTE — Assessment & Plan Note (Signed)
Continue atorvastatin.  CoQ10 was not helpful.

## 2019-08-23 NOTE — Progress Notes (Signed)
This visit was conducted in person.  BP 116/70 (BP Location: Left Arm, Patient Position: Sitting, Cuff Size: Normal)   Pulse 68   Temp 97.8 F (36.6 C) (Temporal)   Ht 5' 11.5" (1.816 m)   Wt 207 lb 7 oz (94.1 kg)   SpO2 95%   BMI 28.53 kg/m    CC: DM f/u visit  Subjective:    Patient ID: Javier Mccoy, male    DOB: 1981-04-24, 39 y.o.   MRN: [id number]  HPI: Javier Mccoy is a 39 y.o. male presenting on 08/23/2019 for Follow-up (Here for 3 mo f/u.)   Completed pfizer vaccine series.   DM - does regularly check sugars: fasting 130-160, PM 150-180. Compliant with antihyperglycemic regimen which includes: novolog flex pen 12/15/15u, basaglar 65u daily (switched from levemir due to concern over lack of 24 hour coverage), metformin 750mg  XR BID (changed last visit due to diarrhea). Finds certain foods aggravate GI symptoms. Occasional low sugars - eats peanut butter crackers or small candy to help - cbg's 70. Denies paresthesias. Last diabetic eye exam DUE - this has been scheduled. Pneumovax: 2018. Prevnar: not due. Glucometer brand: onetouch. DSME: remotely. Dinner is 5-6pm, bedtime 10pm, breakfast is 8am.  Lab Results  Component Value Date   HGBA1C 7.7 (A) 08/23/2019   Diabetic Foot Exam - Simple   Simple Foot Form Diabetic Foot exam was performed with the following findings: Yes 08/23/2019  8:20 AM  Visual Inspection No deformities, no ulcerations, no other skin breakdown bilaterally: Yes Sensation Testing Intact to touch and monofilament testing bilaterally: Yes Pulse Check Posterior Tibialis and Dorsalis pulse intact bilaterally: Yes Comments    Lab Results  Component Value Date   MICROALBUR 2.0 (H) 05/17/2019         Relevant past medical, surgical, family and social history reviewed and updated as indicated. Interim medical history since our last visit reviewed. Allergies and medications reviewed and updated. Outpatient Medications Prior to Visit    Medication Sig Dispense Refill  . cetirizine (ZYRTEC) 10 MG tablet Take 10 mg by mouth daily.    . diphenoxylate-atropine (LOMOTIL) 2.5-0.025 MG tablet Take 2 tablets by mouth 4 (four) times daily as needed for diarrhea or loose stools. 30 tablet 0  . glucose blood (ONETOUCH VERIO) test strip Use as instructed to check sugars three times daily 300 strip 3  . ibuprofen (ADVIL,MOTRIN) 800 MG tablet Take 800 mg by mouth every 8 (eight) hours as needed.    . insulin aspart (NOVOLOG FLEXPEN) 100 UNIT/ML FlexPen Inject 20 Units into the skin 3 (three) times daily with meals. QS 3 months. (04/09/14) 60 mL 3  . Insulin Glargine (BASAGLAR KWIKPEN) 100 UNIT/ML SOPN Inject 0.65 mLs (65 Units total) into the skin at bedtime. QS 3 months 20 pen 3  . Insulin Pen Needle (PEN NEEDLES) 31G X 5 MM MISC Inject 1 pen into the skin 3 (three) times daily. 300 each 3  . metFORMIN (GLUCOPHAGE XR) 750 MG 24 hr tablet Take 1 tablet (750 mg total) by mouth 2 (two) times daily. 180 tablet 1  . nitroGLYCERIN (NITRODUR - DOSED IN MG/24 HR) 0.2 mg/hr patch Apply 1/4 of a patch to the affected area and change every 24 hours (re: tendinopathy) 30 patch 4  . ondansetron (ZOFRAN) 4 MG tablet Take 1 tablet (4 mg total) by mouth every 6 (six) hours. (Patient taking differently: Take 4 mg by mouth every 6 (six) hours. As needed) 30 tablet 0  .  OneTouch Delica Lancets 33G MISC 1 each by Does not apply route as directed. Use as instructed to check blood sugar once daily 100 each 0  . atenolol (TENORMIN) 50 MG tablet TAKE 1 TABLET DAILY 90 tablet 0  . atorvastatin (LIPITOR) 40 MG tablet TAKE 1 TABLET DAILY 90 tablet 3  . Coenzyme Q10 (CO Q10) 100 MG CAPS Take 1 capsule by mouth daily. 30 each    No facility-administered medications prior to visit.     Per HPI unless specifically indicated in ROS section below Review of Systems Objective:  BP 116/70 (BP Location: Left Arm, Patient Position: Sitting, Cuff Size: Normal)   Pulse 68    Temp 97.8 F (36.6 C) (Temporal)   Ht 5' 11.5" (1.816 m)   Wt 207 lb 7 oz (94.1 kg)   SpO2 95%   BMI 28.53 kg/m   Wt Readings from Last 3 Encounters:  08/23/19 207 lb 7 oz (94.1 kg)  05/24/19 203 lb 6.4 oz (92.3 kg)  11/07/18 205 lb (93 kg)      Physical Exam Vitals and nursing note reviewed.  Constitutional:      General: He is not in acute distress.    Appearance: He is well-developed.  HENT:     Head: Normocephalic and atraumatic.     Right Ear: External ear normal.     Left Ear: External ear normal.     Nose: Nose normal.     Mouth/Throat:     Pharynx: No oropharyngeal exudate.  Eyes:     General: No scleral icterus.    Conjunctiva/sclera: Conjunctivae normal.     Pupils: Pupils are equal, round, and reactive to light.  Cardiovascular:     Rate and Rhythm: Normal rate and regular rhythm.     Heart sounds: Normal heart sounds. No murmur.  Pulmonary:     Effort: Pulmonary effort is normal. No respiratory distress.     Breath sounds: Normal breath sounds. No wheezing or rales.  Musculoskeletal:     Cervical back: Normal range of motion and neck supple.     Comments: See HPI for foot exam if done  Lymphadenopathy:     Cervical: No cervical adenopathy.  Skin:    General: Skin is warm and dry.     Findings: No rash.       Results for orders placed or performed in visit on 08/23/19  POCT glycosylated hemoglobin (Hb A1C)  Result Value Ref Range   Hemoglobin A1C 7.7 (A) 4.0 - 5.6 %   HbA1c POC (<> result, manual entry)     HbA1c, POC (prediabetic range)     HbA1c, POC (controlled diabetic range)     Assessment & Plan:  This visit occurred during the SARS-CoV-2 public health emergency.  Safety protocols were in place, including screening questions prior to the visit, additional usage of staff PPE, and extensive cleaning of exam room while observing appropriate contact time as indicated for disinfecting solutions.   Problem List Items Addressed This Visit    Type 2  diabetes mellitus with other specified complication (HCC) - Primary    Chronic, improving. Ideally want A1c <7%.  Continue current regimen. Reviewed prolonged overnight fast likely contributing to impaired hyperglycemia. I suggested he add a small bedtime snack with protein ie yogurt, peanut butter crackers, etc and monitor effect on am sugars.  He does have hypoglycemic awareness at 82s.  Reviewed hypoglycemia management, 15-15 rule reviewed.  RTC 3-4 mo DM f/u visit  Relevant Medications   atorvastatin (LIPITOR) 40 MG tablet   Other Relevant Orders   POCT glycosylated hemoglobin (Hb A1C) (Completed)   Hyperlipidemia associated with type 2 diabetes mellitus ([place])    Continue atorvastatin.  CoQ10 was not helpful.       Relevant Medications   atorvastatin (LIPITOR) 40 MG tablet       Meds ordered this encounter  Medications  . atorvastatin (LIPITOR) 40 MG tablet    Sig: Take 1 tablet (40 mg total) by mouth daily.    Dispense:  90 tablet    Refill:  3  . atenolol (TENORMIN) 50 MG tablet    Sig: Take 1 tablet (50 mg total) by mouth daily.    Dispense:  90 tablet    Refill:  3   Orders Placed This Encounter  Procedures  . POCT glycosylated hemoglobin (Hb A1C)    Patient instructions: Continue current medicines.  You are doing well today. Try bedtime snack with protein to see effect on morning readings.  Return as needed or in 3-4 months for diabetes follow up.   Follow up plan: Return in about 3 months (around 11/22/2019) for follow up visit.  Ria Bush, MD

## 2019-09-03 LAB — HM DIABETES EYE EXAM

## 2019-09-07 ENCOUNTER — Other Ambulatory Visit: Payer: Self-pay

## 2019-09-07 ENCOUNTER — Encounter: Payer: Self-pay | Admitting: Internal Medicine

## 2019-09-07 ENCOUNTER — Ambulatory Visit: Payer: BC Managed Care – PPO | Admitting: Internal Medicine

## 2019-09-07 ENCOUNTER — Encounter: Payer: Self-pay | Admitting: Family Medicine

## 2019-09-07 DIAGNOSIS — S86001A Unspecified injury of right Achilles tendon, initial encounter: Secondary | ICD-10-CM

## 2019-09-07 DIAGNOSIS — S86001D Unspecified injury of right Achilles tendon, subsequent encounter: Secondary | ICD-10-CM | POA: Insufficient documentation

## 2019-09-07 NOTE — Assessment & Plan Note (Signed)
Not rupture fortunately Discussed no weight bearing till the pain resolves--he needs to use both crutches for now Ice if pain---can start heat as tolerated Ibuprofen as well

## 2019-09-07 NOTE — Progress Notes (Signed)
Subjective:    Patient ID: Javier Mccoy, male    DOB: 1980/12/22, 39 y.o.   MRN: [id number]  HPI Here due to right leg injury yesterday This visit occurred during the SARS-CoV-2 public health emergency.  Safety protocols were in place, including screening questions prior to the visit, additional usage of staff PPE, and extensive cleaning of exam room while observing appropriate contact time as indicated for disinfecting solutions.   Was running from 2nd to 3rd playing softball last night Aware of "pop" by right Achilles and up calf some Fairly intense pain  Had to walk to the dugout  Did ice it intermittently last night  Hobbled home Took ibuprofen 800mg  twice since then  This morning--more of a dull pain Pain is bad walking around---using one crutch  Current Outpatient Medications on File Prior to Visit  Medication Sig Dispense Refill  . atenolol (TENORMIN) 50 MG tablet Take 1 tablet (50 mg total) by mouth daily. 90 tablet 3  . atorvastatin (LIPITOR) 40 MG tablet TAKE 1 TABLET DAILY 90 tablet 3  . cetirizine (ZYRTEC) 10 MG tablet Take 10 mg by mouth daily.    . diphenoxylate-atropine (LOMOTIL) 2.5-0.025 MG tablet Take 2 tablets by mouth 4 (four) times daily as needed for diarrhea or loose stools. 30 tablet 0  . glucose blood (ONETOUCH VERIO) test strip Use as instructed to check sugars three times daily 300 strip 3  . ibuprofen (ADVIL,MOTRIN) 800 MG tablet Take 800 mg by mouth every 8 (eight) hours as needed.    . Insulin Glargine (BASAGLAR KWIKPEN) 100 UNIT/ML SOPN Inject 0.65 mLs (65 Units total) into the skin at bedtime. QS 3 months 20 pen 3  . Insulin Pen Needle (PEN NEEDLES) 31G X 5 MM MISC Inject 1 pen into the skin 3 (three) times daily. 300 each 3  . metFORMIN (GLUCOPHAGE XR) 750 MG 24 hr tablet Take 1 tablet (750 mg total) by mouth 2 (two) times daily. 180 tablet 1  . nitroGLYCERIN (NITRODUR - DOSED IN MG/24 HR) 0.2 mg/hr patch Apply 1/4 of a patch to the affected area  and change every 24 hours (re: tendinopathy) 30 patch 4  . NOVOLOG FLEXPEN 100 UNIT/ML FlexPen INJECT 20 UNITS            SUBCUTANEOUSLY 3 TIMES A   DAY WITH MEALS 60 mL 3  . ondansetron (ZOFRAN) 4 MG tablet Take 1 tablet (4 mg total) by mouth every 6 (six) hours. (Patient taking differently: Take 4 mg by mouth every 6 (six) hours. As needed) 30 tablet 0  . OneTouch Delica Lancets 84O MISC 1 each by Does not apply route as directed. Use as instructed to check blood sugar once daily 100 each 0   No current facility-administered medications on file prior to visit.    No Known Allergies  Past Medical History:  Diagnosis Date  . Allergy   . CAP (community acquired pneumonia) 07/27/2017  . Closed fracture of base of fifth metatarsal bone of right foot 01/27/2018  . Diabetes mellitus without complication ([place])   . Hyperlipidemia   . Supraventricular tachycardia [facility])     Past Surgical History:  Procedure Laterality Date  . TONSILECTOMY, ADENOIDECTOMY, BILATERAL MYRINGOTOMY AND TUBES      Family History  Problem Relation Age of Onset  . Thyroid disease Mother   . Hyperlipidemia Father   . Cancer Maternal Grandmother        Breast and lung  . Heart disease Maternal Grandmother   .  Healthy Maternal Grandfather   . Healthy Paternal Grandmother   . Heart disease Paternal Grandfather   . Arthritis Maternal Aunt   . Diabetes Maternal Aunt   . Hyperlipidemia Maternal Aunt     Social History   Socioeconomic History  . Marital status: Married    Spouse name: Not on file  . Number of children: 2  . Years of education: 51  . Highest education level: Not on file  Occupational History  . Occupation: Production designer, theatre/television/film   Tobacco Use  . Smoking status: Never Smoker  . Smokeless tobacco: Never Used  Substance and Sexual Activity  . Alcohol use: Yes    Alcohol/week: 1.0 standard drinks    Types: 1 Glasses of wine per week  . Drug use: No  . Sexual activity: Yes  Other Topics Concern  .  Not on file  Social History Narrative   Lives with husband Javier Mccoy) and 2 children   Occ: college administrator [employer]   Activity: cycle, run and kayak.    Diet: good water, fruits/vegetables daily   Social Determinants of Health   Financial Resource Strain:   . Difficulty of Paying Living Expenses:   Food Insecurity:   . Worried About Programme researcher, broadcasting/film/video in the Last Year:   . Barista in the Last Year:   Transportation Needs:   . Freight forwarder (Medical):   Marland Kitchen Lack of Transportation (Non-Medical):   Physical Activity:   . Days of Exercise per Week:   . Minutes of Exercise per Session:   Stress:   . Feeling of Stress :   Social Connections:   . Frequency of Communication with Friends and Family:   . Frequency of Social Gatherings with Friends and Family:   . Attends Religious Services:   . Active Member of Clubs or Organizations:   . Attends Banker Meetings:   Marland Kitchen Marital Status:   Intimate Partner Violence:   . Fear of Current or Ex-Partner:   . Emotionally Abused:   Marland Kitchen Physically Abused:   . Sexually Abused:    Review of Systems No swelling No fever No bruising    Objective:   Physical Exam  Constitutional: No distress.  Musculoskeletal:     Comments: No right calf swelling or true tenderness----just Achilles tenderness just above insertion point Able to flex and extend ankle normally           Assessment & Plan:

## 2019-09-07 NOTE — Patient Instructions (Signed)
Rosen's Emergency Medicine: Concepts and Clinical Practice (9th ed., pp. 1392-1401). [place], [place]: Elsevier, Inc. Retrieved from https://www.clinicalkey.com/#!/content/[email or web address]  Achilles Tendinitis  Achilles tendinitis is inflammation of the tough, cord-like band that attaches the lower leg muscles to the heel bone (Achilles tendon). This is usually caused by overusing the tendon and the ankle joint. Achilles tendinitis usually gets better over time with treatment and caring for yourself at home. It can take weeks or months to heal completely. What are the causes? This condition may be caused by:  A sudden increase in exercise or activity, such as running.  Doing the same exercises or activities, such as jumping, over and over.  Not warming up calf muscles before exercising.  Exercising in shoes that are worn out or not made for exercise.  Having arthritis or a bone growth (spur) on the back of the heel bone. This can rub against the tendon and hurt it.  Age-related wear and tear. Tendons become less flexible with age and are more likely to be injured. What are the signs or symptoms? Common symptoms of this condition include:  Pain in the Achilles tendon or in the back of the leg, just above the heel. The pain usually gets worse with exercise.  Stiffness or soreness in the back of the leg, especially in the morning.  Swelling of the skin over the Achilles tendon.  Thickening of the tendon.  Trouble standing on tiptoe. How is this diagnosed? This condition is diagnosed based on your symptoms and a physical exam. You may have tests, including:  X-rays.  MRI. How is this treated? The goal of treatment is to relieve symptoms and help your injury heal. Treatment may include:  Decreasing or stopping activities that caused the tendinitis. This may mean switching to low-impact exercises like biking or swimming.  Icing the injured  area.  Doing physical therapy, including strengthening and stretching exercises.  Taking NSAIDs, such as ibuprofen, to help relieve pain and swelling.  Using supportive shoes, wraps, heel lifts, or a walking boot (air cast).  Having surgery. This may be done if your symptoms do not improve after other treatments.  Using high-energy shock wave impulses to stimulate the healing process (extracorporeal shock wave therapy). This is rare.  Having an injection of medicines that help relieve inflammation (corticosteroids). This is rare. Follow these instructions at home: If you have an air cast:  Wear the air cast as told by your health care provider. Remove it only as told by your health care provider.  Loosen it if your toes tingle, become numb, or turn cold and blue.  Keep it clean.  If the air cast is not waterproof: ? Do not let it get wet. ? Cover it with a watertight covering when you take a bath or shower. Managing pain, stiffness, and swelling   If directed, put ice on the injured area. To do this: ? If you have a removable air cast, remove it as told by your health care provider. ? Put ice in a plastic bag. ? Place a towel between your skin and the bag. ? Leave the ice on for 20 minutes, 2-3 times a day.  Move your toes often to reduce stiffness and swelling.  Raise (elevate) your foot above the level of your heart while you are sitting or lying down. Activity  Gradually return to your normal activities as told by your health care provider. Ask your health care provider what activities are safe for you.  Do not do   activities that cause pain.  Consider doing low-impact exercises, like cycling or swimming.  Ask your health care provider when it is safe to drive if you have an air cast on your foot.  If physical therapy was prescribed, do exercises as told by your health care provider or physical therapist. General instructions  If directed, wrap your foot with an  elastic bandage or other wrap. This can help to keep your tendon from moving too much while it heals. Your health care provider will show you how to wrap your foot correctly.  Wear supportive shoes or heel lifts only as told by your health care provider.  Take over-the-counter and prescription medicines only as told by your health care provider.  Keep all follow-up visits as told by your health care provider. This is important. Contact a health care provider if you:  Have symptoms that get worse.  Have pain that does not get better with medicine.  Develop new, unexplained symptoms.  Develop warmth and swelling in your foot.  Have a fever. Get help right away if you:  Have a sudden popping sound or sensation in your Achilles tendon followed by severe pain.  Cannot move your toes or foot.  Cannot put any weight on your foot.  Your foot or toes become numb and look white or blue even after loosening your bandage or air cast. Summary  Achilles tendinitis is inflammation of the tough, cord-like band that attaches the lower leg muscles to the heel bone (Achilles tendon).  This condition is usually caused by overusing the tendon and the ankle joint. It can also be caused by arthritis or normal aging.  The most common symptoms of this condition include pain, swelling, or stiffness in the Achilles tendon or in the back of the leg.  This condition is usually treated by decreasing or stopping activities that caused the tendinitis, icing the injured area, taking NSAIDs, and doing physical therapy. This information is not intended to replace advice given to you by your health care provider. Make sure you discuss any questions you have with your health care provider. Document Revised: 08/28/2018 Document Reviewed: 08/28/2018 Elsevier Patient Education  2020 Elsevier Inc.  

## 2019-11-29 ENCOUNTER — Encounter: Payer: Self-pay | Admitting: Family Medicine

## 2019-11-29 ENCOUNTER — Other Ambulatory Visit: Payer: Self-pay | Admitting: Family Medicine

## 2019-11-29 ENCOUNTER — Other Ambulatory Visit: Payer: Self-pay

## 2019-11-29 ENCOUNTER — Ambulatory Visit: Payer: BC Managed Care – PPO | Admitting: Family Medicine

## 2019-11-29 VITALS — BP 116/76 | HR 74 | Temp 97.5°F | Ht 71.5 in | Wt 212.6 lb

## 2019-11-29 DIAGNOSIS — Z794 Long term (current) use of insulin: Secondary | ICD-10-CM | POA: Diagnosis not present

## 2019-11-29 DIAGNOSIS — S86001D Unspecified injury of right Achilles tendon, subsequent encounter: Secondary | ICD-10-CM

## 2019-11-29 DIAGNOSIS — E1169 Type 2 diabetes mellitus with other specified complication: Secondary | ICD-10-CM

## 2019-11-29 LAB — POCT GLYCOSYLATED HEMOGLOBIN (HGB A1C): Hemoglobin A1C: 7.6 % — AB (ref 4.0–5.6)

## 2019-11-29 MED ORDER — RYBELSUS 3 MG PO TABS: 3.0000 mg | ORAL_TABLET | Freq: Every day | ORAL | 3 refills | Status: DC

## 2019-11-29 MED ORDER — BASAGLAR KWIKPEN 100 UNIT/ML ~~LOC~~ SOPN: 65.0000 [IU] | PEN_INJECTOR | Freq: Every day | SUBCUTANEOUS | 3 refills | Status: DC

## 2019-11-29 MED ORDER — METFORMIN HCL ER 750 MG PO TB24: 750.0000 mg | ORAL_TABLET | Freq: Two times a day (BID) | ORAL | 3 refills | Status: DC

## 2019-11-29 NOTE — Progress Notes (Signed)
This visit was conducted in person.  BP 116/76 (BP Location: Left Arm, Patient Position: Sitting, Cuff Size: Normal)   Pulse 74   Temp (!) 97.5 F (36.4 C) (Temporal)   Ht 5' 11.5" (1.816 m)   Wt 212 lb 9 oz (96.4 kg)   SpO2 96%   BMI 29.23 kg/m    CC: 3 mo DM f/u visit  Subjective:    Patient ID: Javier Mccoy, male    DOB: 1980/11/03, 39 y.o.   MRN: [id number]  HPI: Javier Mccoy is a 39 y.o. male presenting on 11/29/2019 for Diabetes (Here for 3 mo f/u. )   DOI: 09/06/2019 R achilles injury saw Dr Alphonsus Sias 08/2019 for this, rec conservative measures. Thought partial achilles tear managed with ibuprofen, ice, wrapping. Used crutches for 2-3 weeks and has rested ankle.   DM - does regularly check sugars fasting 110-160, pre-dinner 120s. Compliant with antihyperglycemic regimen which includes: novolog flexpen 12/15/15u, basaglar 65u daily, metformin XR 750mg  bid. GLP1 RA caused nausea. Last visit we discussed adding snack before bedtime - this has helped am readings. Denies low sugars or hypoglycemic symptoms. Denies paresthesias. Last diabetic eye exam 08/2019. Pneumovax: 2018. Prevnar: not due. Glucometer brand: onetouch. DSME: remotely.  Lab Results  Component Value Date   HGBA1C 7.6 (A) 11/29/2019   Diabetic Foot Exam - Simple   Simple Foot Form Diabetic Foot exam was performed with the following findings: Yes 11/29/2019 12:48 PM  Visual Inspection No deformities, no ulcerations, no other skin breakdown bilaterally: Yes Sensation Testing Intact to touch and monofilament testing bilaterally: Yes Pulse Check Comments    Lab Results  Component Value Date   MICROALBUR 2.0 (H) 05/17/2019         Relevant past medical, surgical, family and social history reviewed and updated as indicated. Interim medical history since our last visit reviewed. Allergies and medications reviewed and updated. Outpatient Medications Prior to Visit  Medication Sig Dispense Refill  .  atenolol (TENORMIN) 50 MG tablet Take 1 tablet (50 mg total) by mouth daily. 90 tablet 3  . atorvastatin (LIPITOR) 40 MG tablet TAKE 1 TABLET DAILY 90 tablet 3  . cetirizine (ZYRTEC) 10 MG tablet Take 10 mg by mouth daily.    . diphenoxylate-atropine (LOMOTIL) 2.5-0.025 MG tablet Take 2 tablets by mouth 4 (four) times daily as needed for diarrhea or loose stools. 30 tablet 0  . glucose blood (ONETOUCH VERIO) test strip Use as instructed to check sugars three times daily 300 strip 3  . ibuprofen (ADVIL,MOTRIN) 800 MG tablet Take 800 mg by mouth every 8 (eight) hours as needed.    . Insulin Pen Needle (PEN NEEDLES) 31G X 5 MM MISC Inject 1 pen into the skin 3 (three) times daily. 300 each 3  . nitroGLYCERIN (NITRODUR - DOSED IN MG/24 HR) 0.2 mg/hr patch Apply 1/4 of a patch to the affected area and change every 24 hours (re: tendinopathy) 30 patch 4  . NOVOLOG FLEXPEN 100 UNIT/ML FlexPen INJECT 20 UNITS            SUBCUTANEOUSLY 3 TIMES A   DAY WITH MEALS 60 mL 3  . ondansetron (ZOFRAN) 4 MG tablet Take 1 tablet (4 mg total) by mouth every 6 (six) hours. (Patient taking differently: Take 4 mg by mouth every 6 (six) hours. As needed) 30 tablet 0  . OneTouch Delica Lancets 33G MISC 1 each by Does not apply route as directed. Use as instructed to check blood sugar  once daily 100 each 0  . Insulin Glargine (BASAGLAR KWIKPEN) 100 UNIT/ML SOPN Inject 0.65 mLs (65 Units total) into the skin at bedtime. QS 3 months 20 pen 3  . metFORMIN (GLUCOPHAGE XR) 750 MG 24 hr tablet Take 1 tablet (750 mg total) by mouth 2 (two) times daily. 180 tablet 1   No facility-administered medications prior to visit.     Per HPI unless specifically indicated in ROS section below Review of Systems Objective:  BP 116/76 (BP Location: Left Arm, Patient Position: Sitting, Cuff Size: Normal)   Pulse 74   Temp (!) 97.5 F (36.4 C) (Temporal)   Ht 5' 11.5" (1.816 m)   Wt 212 lb 9 oz (96.4 kg)   SpO2 96%   BMI 29.23 kg/m   Wt  Readings from Last 3 Encounters:  11/29/19 212 lb 9 oz (96.4 kg)  09/07/19 206 lb 12 oz (93.8 kg)  08/23/19 207 lb 7 oz (94.1 kg)      Physical Exam Vitals and nursing note reviewed.  Constitutional:      General: He is not in acute distress.    Appearance: Normal appearance. He is well-developed. He is not ill-appearing.  HENT:     Head: Normocephalic and atraumatic.     Right Ear: External ear normal.     Left Ear: External ear normal.     Nose: Nose normal.     Mouth/Throat:     Pharynx: No oropharyngeal exudate.  Eyes:     General: No scleral icterus.    Conjunctiva/sclera: Conjunctivae normal.     Pupils: Pupils are equal, round, and reactive to light.  Cardiovascular:     Rate and Rhythm: Normal rate and regular rhythm.     Pulses: Normal pulses.     Heart sounds: Normal heart sounds. No murmur heard.   Pulmonary:     Effort: Pulmonary effort is normal. No respiratory distress.     Breath sounds: Normal breath sounds. No wheezing, rhonchi or rales.  Musculoskeletal:        General: Tenderness present. Normal range of motion.     Cervical back: Normal range of motion and neck supple.     Right lower leg: No edema.     Left lower leg: No edema.     Comments:  See HPI for foot exam if done Chronic mild thickening at left achilles tendon insertion Mild discomfort to palpation at right achilles, diminished mobility with seated calf squeeze R compared to L.   Lymphadenopathy:     Cervical: No cervical adenopathy.  Skin:    General: Skin is warm and dry.     Capillary Refill: Capillary refill takes less than 2 seconds.     Findings: No rash.  Neurological:     General: No focal deficit present.     Mental Status: He is alert.  Psychiatric:        Mood and Affect: Mood normal.        Behavior: Behavior normal.       Results for orders placed or performed in visit on 11/29/19  POCT glycosylated hemoglobin (Hb A1C)  Result Value Ref Range   Hemoglobin A1C 7.6 (A)  4.0 - 5.6 %   HbA1c POC (<> result, manual entry)     HbA1c, POC (prediabetic range)     HbA1c, POC (controlled diabetic range)     Assessment & Plan:  This visit occurred during the SARS-CoV-2 public health emergency.  Safety protocols were in place,  including screening questions prior to the visit, additional usage of staff PPE, and extensive cleaning of exam room while observing appropriate contact time as indicated for disinfecting solutions.   Problem List Items Addressed This Visit    Type 2 diabetes mellitus with other specified complication (HCC) - Primary    Chronic, remains above goal.  Reviewed insulin dosing. Rec start checking some readings before meals and 2 hours after meals to help dose mealtime insulin. Fasting sugars remain high - could tolerate higher basaglar dose. He also notes other basal insulins were more effective - will again price out with insurance coverage which may have changed since we started basaglar.  Injection GLP1 RA caused nausea. Will also have him price out trial of oral GLP1 RA Rybelsus start 3mg  daily update with effect.       Relevant Medications   metFORMIN (GLUCOPHAGE XR) 750 MG 24 hr tablet   Insulin Glargine (BASAGLAR KWIKPEN) 100 UNIT/ML   Semaglutide (RYBELSUS) 3 MG TABS   Other Relevant Orders   POCT glycosylated hemoglobin (Hb A1C) (Completed)   Achilles tendon injury, right, subsequent encounter    Anticipate partial tear that is slowly healing.  Discussed heel lift and provided with achilles tendinopathy exercises from [facility] pt advisor. Discussed option of [facility] referral vs formal PT rehab - he will continue home rehab at this time.           Meds ordered this encounter  Medications  . metFORMIN (GLUCOPHAGE XR) 750 MG 24 hr tablet    Sig: Take 1 tablet (750 mg total) by mouth 2 (two) times daily.    Dispense:  180 tablet    Refill:  3  . Insulin Glargine (BASAGLAR KWIKPEN) 100 UNIT/ML    Sig: Inject 0.65 mLs (65 Units total) into the  skin at bedtime. QS 3 months    Dispense:  60 mL    Refill:  3  . Semaglutide (RYBELSUS) 3 MG TABS    Sig: Take 3 mg by mouth daily.    Dispense:  30 tablet    Refill:  3   Orders Placed This Encounter  Procedures  . POCT glycosylated hemoglobin (Hb A1C)    Patient Instructions  Use heel lift to shoes  Do exercises provided today.  Start checking sugars before and 2 hours after a meal to see how effective the novolog is. Let me know readings, may discuss increasing dose if needed.  Price out rybelsus sent to pharmacy.  Let me know coverages for basal insulin.    Follow up plan: Return in about 3 months (around 02/29/2020) for follow up visit.  13/08/2019, MD

## 2019-11-29 NOTE — Patient Instructions (Addendum)
Use heel lift to shoes  Do exercises provided today.  Start checking sugars before and 2 hours after a meal to see how effective the novolog is. Let me know readings, may discuss increasing dose if needed.  Price out rybelsus sent to pharmacy.  Let me know coverages for basal insulin.

## 2019-12-01 NOTE — Assessment & Plan Note (Signed)
Anticipate partial tear that is slowly healing.  Discussed heel lift and provided with achilles tendinopathy exercises from [facility] pt advisor. Discussed option of [facility] referral vs formal PT rehab - he will continue home rehab at this time.

## 2019-12-01 NOTE — Assessment & Plan Note (Addendum)
Chronic, remains above goal.  Reviewed insulin dosing. Rec start checking some readings before meals and 2 hours after meals to help dose mealtime insulin. Fasting sugars remain high - could tolerate higher basaglar dose. He also notes other basal insulins were more effective - will again price out with insurance coverage which may have changed since we started basaglar.  Injection GLP1 RA caused nausea. Will also have him price out trial of oral GLP1 RA Rybelsus start 3mg  daily update with effect.

## 2020-01-28 ENCOUNTER — Encounter: Payer: Self-pay | Admitting: Family Medicine

## 2020-01-28 DIAGNOSIS — Z794 Long term (current) use of insulin: Secondary | ICD-10-CM

## 2020-02-01 NOTE — Addendum Note (Signed)
Addended by: Eustaquio Boyden on: 02/01/2020 12:08 PM   Modules accepted: Orders

## 2020-02-29 ENCOUNTER — Encounter: Payer: Self-pay | Admitting: Family Medicine

## 2020-02-29 ENCOUNTER — Other Ambulatory Visit: Payer: Self-pay

## 2020-02-29 ENCOUNTER — Ambulatory Visit: Payer: BC Managed Care – PPO | Admitting: Family Medicine

## 2020-02-29 VITALS — BP 118/78 | HR 74 | Temp 97.8°F | Ht 71.5 in | Wt 218.4 lb

## 2020-02-29 DIAGNOSIS — S86001D Unspecified injury of right Achilles tendon, subsequent encounter: Secondary | ICD-10-CM | POA: Diagnosis not present

## 2020-02-29 DIAGNOSIS — Z794 Long term (current) use of insulin: Secondary | ICD-10-CM | POA: Diagnosis not present

## 2020-02-29 DIAGNOSIS — Z23 Encounter for immunization: Secondary | ICD-10-CM | POA: Diagnosis not present

## 2020-02-29 DIAGNOSIS — E1169 Type 2 diabetes mellitus with other specified complication: Secondary | ICD-10-CM | POA: Diagnosis not present

## 2020-02-29 LAB — POCT GLYCOSYLATED HEMOGLOBIN (HGB A1C): Hemoglobin A1C: 7.6 % — AB (ref 4.0–5.6)

## 2020-02-29 NOTE — Progress Notes (Signed)
This visit was conducted in person.  BP 118/78 (BP Location: Left Arm, Patient Position: Sitting, Cuff Size: Large)   Pulse 74   Temp 97.8 F (36.6 C) (Temporal)   Ht 5' 11.5" (1.816 m)   Wt 218 lb 7 oz (99.1 kg)   SpO2 96%   BMI 30.04 kg/m    CC: 3 mo f/u visit  Subjective:    Patient ID: Javier Mccoy, male    DOB: 07-24-1980, 39 y.o.   MRN: [id number]  HPI: Javier Mccoy is a 39 y.o. male presenting on 02/29/2020 for Diabetes (Here for 3 mo f/u.)   Upcoming appointment next week with Dr Lou Miner endocrinologist with [facility] in [place]. Pt interested in insulin pump.   HLD - tolerating lipitor 40mg  daily HTN - tolerates atenolol 50mg  daily - takes this for h/o SVT. Not on ACEI.  Partial R achilles tendon tear 08/2019 - managed conservatively. Using heel lifts in flat shoes. Continues home exercises. This has limited activity.   DM - does regularly check sugars: fasting 100s, PM 180-230. Compliant with antihyperglycemic regimen which includes: novolog flexpen 15/18/20u, basaglar 70u daily, metformin XR 750mg  BID. Other basal insulins have been more effective but due to formulary changes he has needed basaglar (toujeo no longer covered). Did not tolerate weekly GLP1 RA IM (nausea). Last visit we prescribed rybelsus - no significant benefit in sugar control, very expensive ($90/25mo). Denies low sugars or hypoglycemic symptoms. Denies paresthesias. Last diabetic eye exam 08/2019. Pneumovax: 2081. Prevnar: not due. Glucometer brand: onetouch. DSME: completed remotely. Lab Results  Component Value Date   HGBA1C 7.6 (A) 02/29/2020   Diabetic Foot Exam - Simple   No data filed     Lab Results  Component Value Date   MICROALBUR 2.0 (H) 05/17/2019         Relevant past medical, surgical, family and social history reviewed and updated as indicated. Interim medical history since our last visit reviewed. Allergies and medications reviewed and updated. Outpatient Medications Prior  to Visit  Medication Sig Dispense Refill  . atenolol (TENORMIN) 50 MG tablet Take 1 tablet (50 mg total) by mouth daily. 90 tablet 3  . atorvastatin (LIPITOR) 40 MG tablet TAKE 1 TABLET DAILY 90 tablet 3  . cetirizine (ZYRTEC) 10 MG tablet Take 10 mg by mouth daily.    . diphenoxylate-atropine (LOMOTIL) 2.5-0.025 MG tablet Take 2 tablets by mouth 4 (four) times daily as needed for diarrhea or loose stools. 30 tablet 0  . glucose blood (ONETOUCH VERIO) test strip Use as instructed to check sugars three times daily 300 strip 3  . ibuprofen (ADVIL,MOTRIN) 800 MG tablet Take 800 mg by mouth every 8 (eight) hours as needed.    . Insulin Glargine (BASAGLAR KWIKPEN) 100 UNIT/ML Inject 70 Units into the skin at bedtime. QS 3 months 60 mL   . Insulin Pen Needle (PEN NEEDLES) 31G X 5 MM MISC Inject 1 pen into the skin 3 (three) times daily. 300 each 3  . metFORMIN (GLUCOPHAGE XR) 750 MG 24 hr tablet Take 1 tablet (750 mg total) by mouth 2 (two) times daily. 180 tablet 3  . NOVOLOG FLEXPEN 100 UNIT/ML FlexPen INJECT 20 UNITS            SUBCUTANEOUSLY 3 TIMES A   DAY WITH MEALS 60 mL 3  . ondansetron (ZOFRAN) 4 MG tablet Take 1 tablet (4 mg total) by mouth every 6 (six) hours. (Patient taking differently: Take 4 mg by mouth every  6 (six) hours. As needed) 30 tablet 0  . OneTouch Delica Lancets 33G MISC 1 each by Does not apply route as directed. Use as instructed to check blood sugar once daily 100 each 0  . Insulin Glargine (BASAGLAR KWIKPEN) 100 UNIT/ML Inject 0.65 mLs (65 Units total) into the skin at bedtime. QS 3 months 60 mL 3  . nitroGLYCERIN (NITRODUR - DOSED IN MG/24 HR) 0.2 mg/hr patch Apply 1/4 of a patch to the affected area and change every 24 hours (re: tendinopathy) 30 patch 4  . Semaglutide (RYBELSUS) 3 MG TABS Take 3 mg by mouth daily. 30 tablet 3   No facility-administered medications prior to visit.     Per HPI unless specifically indicated in ROS section below Review of  Systems Objective:  BP 118/78 (BP Location: Left Arm, Patient Position: Sitting, Cuff Size: Large)   Pulse 74   Temp 97.8 F (36.6 C) (Temporal)   Ht 5' 11.5" (1.816 m)   Wt 218 lb 7 oz (99.1 kg)   SpO2 96%   BMI 30.04 kg/m   Wt Readings from Last 3 Encounters:  02/29/20 218 lb 7 oz (99.1 kg)  11/29/19 212 lb 9 oz (96.4 kg)  09/07/19 206 lb 12 oz (93.8 kg)      Physical Exam Vitals and nursing note reviewed.  Constitutional:      Appearance: Normal appearance. He is not ill-appearing.  Cardiovascular:     Rate and Rhythm: Normal rate and regular rhythm.     Pulses: Normal pulses.     Heart sounds: Normal heart sounds. No murmur heard.   Pulmonary:     Effort: Pulmonary effort is normal. No respiratory distress.     Breath sounds: Normal breath sounds. No wheezing, rhonchi or rales.  Musculoskeletal:        General: Swelling present.     Right lower leg: No edema.     Left lower leg: No edema.     Comments: Evident swelling to R achilles tendon without significant pain, redness, warmth. No haglund deformity.   Skin:    General: Skin is warm and dry.  Neurological:     Mental Status: He is alert.  Psychiatric:        Mood and Affect: Mood normal.        Behavior: Behavior normal.       Results for orders placed or performed in visit on 02/29/20  POCT glycosylated hemoglobin (Hb A1C)  Result Value Ref Range   Hemoglobin A1C 7.6 (A) 4.0 - 5.6 %   HbA1c POC (<> result, manual entry)     HbA1c, POC (prediabetic range)     HbA1c, POC (controlled diabetic range)     Assessment & Plan:  This visit occurred during the SARS-CoV-2 public health emergency.  Safety protocols were in place, including screening questions prior to the visit, additional usage of staff PPE, and extensive cleaning of exam room while observing appropriate contact time as indicated for disinfecting solutions.   Problem List Items Addressed This Visit    Type 2 diabetes mellitus with other specified  complication (HCC) - Primary    Chronic, stable but above goal.  He may be interested in insulin pump.  He has upcoming endo appointment next week. Will await their recommendations. No changes made today.       Relevant Medications   Insulin Glargine (BASAGLAR KWIKPEN) 100 UNIT/ML   Other Relevant Orders   POCT glycosylated hemoglobin (Hb A1C) (Completed)   Achilles  tendon injury, right, subsequent encounter    Partial tear 08/2019, now with marked swelling at achilles tendon. Suggested voltaren gel topically. Suggested f/u appt with Dr Patsy Lager if not improving.        Other Visit Diagnoses    Need for influenza vaccination       Relevant Orders   Flu Vaccine QUAD 36+ mos IM (Completed)       No orders of the defined types were placed in this encounter.  Orders Placed This Encounter  Procedures  . Flu Vaccine QUAD 36+ mos IM  . POCT glycosylated hemoglobin (Hb A1C)    Follow up plan: Return in about 3 months (around 05/31/2020) for annual exam, prior fasting for blood work.  Eustaquio Boyden, MD

## 2020-02-29 NOTE — Assessment & Plan Note (Signed)
Chronic, stable but above goal.  He may be interested in insulin pump.  He has upcoming endo appointment next week. Will await their recommendations. No changes made today.

## 2020-02-29 NOTE — Patient Instructions (Addendum)
Return as needed or in 3 months for physical.  Pick up voltaren cream to use at right achilles tendon twice daily for 1-2 weeks. Consider appointment with Dr Patsy Lager (sports medicine) if not improving.  We will await endo evaluation.

## 2020-02-29 NOTE — Assessment & Plan Note (Signed)
Partial tear 08/2019, now with marked swelling at achilles tendon. Suggested voltaren gel topically. Suggested f/u appt with Dr Patsy Lager if not improving.

## 2020-05-18 ENCOUNTER — Other Ambulatory Visit: Payer: Self-pay | Admitting: Family Medicine

## 2020-05-19 ENCOUNTER — Encounter: Payer: Self-pay | Admitting: Family Medicine

## 2020-05-20 MED ORDER — METFORMIN HCL 500 MG PO TABS: 500.0000 mg | ORAL_TABLET | Freq: Three times a day (TID) | ORAL | 1 refills | Status: DC

## 2020-06-01 ENCOUNTER — Other Ambulatory Visit: Payer: Self-pay | Admitting: Family Medicine

## 2020-06-01 DIAGNOSIS — E785 Hyperlipidemia, unspecified: Secondary | ICD-10-CM

## 2020-06-01 DIAGNOSIS — Z794 Long term (current) use of insulin: Secondary | ICD-10-CM

## 2020-06-01 DIAGNOSIS — E1169 Type 2 diabetes mellitus with other specified complication: Secondary | ICD-10-CM

## 2020-06-04 ENCOUNTER — Other Ambulatory Visit (INDEPENDENT_AMBULATORY_CARE_PROVIDER_SITE_OTHER): Payer: BC Managed Care – PPO

## 2020-06-04 ENCOUNTER — Other Ambulatory Visit: Payer: Self-pay

## 2020-06-04 DIAGNOSIS — E785 Hyperlipidemia, unspecified: Secondary | ICD-10-CM

## 2020-06-04 DIAGNOSIS — Z794 Long term (current) use of insulin: Secondary | ICD-10-CM

## 2020-06-04 DIAGNOSIS — E1169 Type 2 diabetes mellitus with other specified complication: Secondary | ICD-10-CM | POA: Diagnosis not present

## 2020-06-04 LAB — COMPREHENSIVE METABOLIC PANEL
ALT: 25 U/L (ref 0–53)
AST: 17 U/L (ref 0–37)
Albumin: 4.2 g/dL (ref 3.5–5.2)
Alkaline Phosphatase: 72 U/L (ref 39–117)
BUN: 20 mg/dL (ref 6–23)
CO2: 26 mEq/L (ref 19–32)
Calcium: 9.1 mg/dL (ref 8.4–10.5)
Chloride: 103 mEq/L (ref 96–112)
Creatinine, Ser: 0.98 mg/dL (ref 0.40–1.50)
GFR: 96.55 mL/min (ref >60.00)
Glucose, Bld: 239 mg/dL — ABNORMAL HIGH (ref 70–99)
Potassium: 4.3 mEq/L (ref 3.5–5.1)
Sodium: 137 mEq/L (ref 135–145)
Total Bilirubin: 1.1 mg/dL (ref 0.2–1.2)
Total Protein: 6.7 g/dL (ref 6.0–8.3)

## 2020-06-04 LAB — MICROALBUMIN / CREATININE URINE RATIO
Creatinine,U: 218.5 mg/dL
Microalb Creat Ratio: 0.4 mg/g (ref 0.0–30.0)
Microalb, Ur: 0.9 mg/dL (ref 0.0–1.9)

## 2020-06-04 LAB — LIPID PANEL
Cholesterol: 165 mg/dL (ref 0–200)
HDL: 44.4 mg/dL (ref >39.00)
LDL Cholesterol: 92 mg/dL (ref 0–99)
NonHDL: 120.39
Total CHOL/HDL Ratio: 4
Triglycerides: 142 mg/dL (ref 0.0–149.0)
VLDL: 28.4 mg/dL (ref 0.0–40.0)

## 2020-06-04 LAB — HEMOGLOBIN A1C: Hgb A1c MFr Bld: 7.3 % — ABNORMAL HIGH (ref 4.6–6.5)

## 2020-06-04 NOTE — Addendum Note (Signed)
Addended by: Aquilla Solian on: 06/04/2020 09:59 AM   Modules accepted: Orders

## 2020-06-11 ENCOUNTER — Ambulatory Visit (INDEPENDENT_AMBULATORY_CARE_PROVIDER_SITE_OTHER): Payer: BC Managed Care – PPO | Admitting: Family Medicine

## 2020-06-11 ENCOUNTER — Encounter: Payer: Self-pay | Admitting: Family Medicine

## 2020-06-11 ENCOUNTER — Other Ambulatory Visit: Payer: Self-pay

## 2020-06-11 VITALS — BP 124/80 | HR 80 | Temp 97.5°F | Ht 71.5 in | Wt 225.0 lb

## 2020-06-11 DIAGNOSIS — Z0000 Encounter for general adult medical examination without abnormal findings: Secondary | ICD-10-CM | POA: Diagnosis not present

## 2020-06-11 DIAGNOSIS — E785 Hyperlipidemia, unspecified: Secondary | ICD-10-CM | POA: Diagnosis not present

## 2020-06-11 DIAGNOSIS — E1169 Type 2 diabetes mellitus with other specified complication: Secondary | ICD-10-CM

## 2020-06-11 DIAGNOSIS — Z794 Long term (current) use of insulin: Secondary | ICD-10-CM | POA: Diagnosis not present

## 2020-06-11 NOTE — Patient Instructions (Addendum)
You are doing well today  I'm glad sugar levels are getting better on insulin pump  Return as needed or in 6 months for diabetes follow up visit.   Health Maintenance, Male Adopting a healthy lifestyle and getting preventive care are important in promoting health and wellness. Ask your health care provider about:  The right schedule for you to have regular tests and exams.  Things you can do on your own to prevent diseases and keep yourself healthy. What should I know about diet, weight, and exercise? Eat a healthy diet  Eat a diet that includes plenty of vegetables, fruits, low-fat dairy products, and lean protein.  Do not eat a lot of foods that are high in solid fats, added sugars, or sodium.   Maintain a healthy weight Body mass index (BMI) is a measurement that can be used to identify possible weight problems. It estimates body fat based on height and weight. Your health care provider can help determine your BMI and help you achieve or maintain a healthy weight. Get regular exercise Get regular exercise. This is one of the most important things you can do for your health. Most adults should:  Exercise for at least 150 minutes each week. The exercise should increase your heart rate and make you sweat (moderate-intensity exercise).  Do strengthening exercises at least twice a week. This is in addition to the moderate-intensity exercise.  Spend less time sitting. Even light physical activity can be beneficial. Watch cholesterol and blood lipids Have your blood tested for lipids and cholesterol at 41 years of age, then have this test every 5 years. You may need to have your cholesterol levels checked more often if:  Your lipid or cholesterol levels are high.  You are older than 41 years of age.  You are at high risk for heart disease. What should I know about cancer screening? Many types of cancers can be detected early and may often be prevented. Depending on your health history  and family history, you may need to have cancer screening at various ages. This may include screening for:  Colorectal cancer.  Prostate cancer.  Skin cancer.  Lung cancer. What should I know about heart disease, diabetes, and high blood pressure? Blood pressure and heart disease  High blood pressure causes heart disease and increases the risk of stroke. This is more likely to develop in people who have high blood pressure readings, are of African descent, or are overweight.  Talk with your health care provider about your target blood pressure readings.  Have your blood pressure checked: ? Every 3-5 years if you are 47-3 years of age. ? Every year if you are 51 years old or older.  If you are between the ages of 44 and 36 and are a current or former smoker, ask your health care provider if you should have a one-time screening for abdominal aortic aneurysm (AAA). Diabetes Have regular diabetes screenings. This checks your fasting blood sugar level. Have the screening done:  Once every three years after age 60 if you are at a normal weight and have a low risk for diabetes.  More often and at a younger age if you are overweight or have a high risk for diabetes. What should I know about preventing infection? Hepatitis B If you have a higher risk for hepatitis B, you should be screened for this virus. Talk with your health care provider to find out if you are at risk for hepatitis B infection. Hepatitis C  Blood testing is recommended for:  Everyone born from [birth year] through 1966.  Anyone with known risk factors for hepatitis C. Sexually transmitted infections (STIs)  You should be screened each year for STIs, including gonorrhea and chlamydia, if: ? You are sexually active and are younger than 41 years of age. ? You are older than 41 years of age and your health care provider tells you that you are at risk for this type of infection. ? Your sexual activity has changed since you were  last screened, and you are at increased risk for chlamydia or gonorrhea. Ask your health care provider if you are at risk.  Ask your health care provider about whether you are at high risk for HIV. Your health care provider may recommend a prescription medicine to help prevent HIV infection. If you choose to take medicine to prevent HIV, you should first get tested for HIV. You should then be tested every 3 months for as long as you are taking the medicine. Follow these instructions at home: Lifestyle  Do not use any products that contain nicotine or tobacco, such as cigarettes, e-cigarettes, and chewing tobacco. If you need help quitting, ask your health care provider.  Do not use street drugs.  Do not share needles.  Ask your health care provider for help if you need support or information about quitting drugs. Alcohol use  Do not drink alcohol if your health care provider tells you not to drink.  If you drink alcohol: ? Limit how much you have to 0-2 drinks a day. ? Be aware of how much alcohol is in your drink. In the [place] one drink equals one 12 oz bottle of beer (355 mL), one 5 oz glass of wine (148 mL), or one 1 oz glass of hard liquor (44 mL). General instructions  Schedule regular health, dental, and eye exams.  Stay current with your vaccines.  Tell your health care provider if: ? You often feel depressed. ? You have ever been abused or do not feel safe at home. Summary  Adopting a healthy lifestyle and getting preventive care are important in promoting health and wellness.  Follow your health care provider's instructions about healthy diet, exercising, and getting tested or screened for diseases.  Follow your health care provider's instructions on monitoring your cholesterol and blood pressure. This information is not intended to replace advice given to you by your health care provider. Make sure you discuss any questions you have with your health care  provider. Document Revised: 04/05/2018 Document Reviewed: 04/05/2018 Elsevier Patient Education  2021 [employer].

## 2020-06-11 NOTE — Progress Notes (Signed)
Patient ID: Javier Mccoy, male    DOB: 08-Jan-1981, 40 y.o.   MRN: [id number]  This visit was conducted in person.  BP 124/80   Pulse 80   Temp (!) 97.5 F (36.4 C) (Temporal)   Ht 5' 11.5" (1.816 m)   Wt 225 lb (102.1 kg)   SpO2 96%   BMI 30.94 kg/m    CC: CPE Subjective:   HPI: Javier Mccoy is a 40 y.o. male presenting on 06/11/2020 for Annual Exam   DM - established with [facility] endocrinology for insulin pump. Metformin XR changed to IR due to recall.   Preventative: Flu shot yearly COVID vaccine Pfizer 06/2019 x2, booster 03/2020  Pneumovax 04/2016 Td 2014 Seat belt use discussed Sunscreen use discussed. No changing moles on skin. Cyst on back since college after paintball hit him Non smoker Alcohol - 2-3 wine glasses/wk Dentist q6 mo Eye doctor yearly (due)  Lives with husband [name](Steve) and 2 children  Occ: college administrator [employer]  Activity: has restarted walking (2-3 mi/day), rowing - limits due to achilles injury  Diet: good water, fruits/vegetables daily     Relevant past medical, surgical, family and social history reviewed and updated as indicated. Interim medical history since our last visit reviewed. Allergies and medications reviewed and updated. Outpatient Medications Prior to Visit  Medication Sig Dispense Refill  . atenolol (TENORMIN) 50 MG tablet TAKE 1 TABLET DAILY 90 tablet 3  . atorvastatin (LIPITOR) 40 MG tablet TAKE 1 TABLET DAILY 90 tablet 3  . cetirizine (ZYRTEC) 10 MG tablet Take 10 mg by mouth daily.    . Continuous Blood Gluc Receiver (FREESTYLE LIBRE 14 DAY READER) DEVI by Does not apply route.    . Continuous Blood Gluc Sensor (FREESTYLE LIBRE 14 DAY SENSOR) MISC by Does not apply route.    . diphenoxylate-atropine (LOMOTIL) 2.5-0.025 MG tablet Take 2 tablets by mouth 4 (four) times daily as needed for diarrhea or loose stools. 30 tablet 0  . ibuprofen (ADVIL,MOTRIN) 800 MG tablet Take 800 mg by mouth every 8 (eight) hours as  needed.    . insulin aspart (NOVOLOG) 100 UNIT/ML injection To use with pump    . Insulin Pen Needle (PEN NEEDLES) 31G X 5 MM MISC Inject 1 pen into the skin 3 (three) times daily. 300 each 3  . metFORMIN (GLUCOPHAGE XR) 750 MG 24 hr tablet Take 1 tablet (750 mg total) by mouth 2 (two) times daily. 180 tablet 3  . metFORMIN (GLUCOPHAGE) 500 MG tablet Take 1 tablet (500 mg total) by mouth with breakfast, with lunch, and with evening meal. 270 tablet 1  . ondansetron (ZOFRAN) 4 MG tablet Take 1 tablet (4 mg total) by mouth every 6 (six) hours. (Patient taking differently: Take 4 mg by mouth every 6 (six) hours. As needed) 30 tablet 0  . Insulin Disposable Pump (OMNIPOD DASH 5 PACK PODS) MISC Inject into the skin.    Marland Kitchen. glucose blood (ONETOUCH VERIO) test strip Use as instructed to check sugars three times daily 300 strip 3  . Insulin Glargine (BASAGLAR KWIKPEN) 100 UNIT/ML Inject 70 Units into the skin at bedtime. QS 3 months 60 mL   . NOVOLOG FLEXPEN 100 UNIT/ML FlexPen INJECT 20 UNITS            SUBCUTANEOUSLY 3 TIMES A   DAY WITH MEALS 60 mL 3  . OneTouch Delica Lancets 33G MISC 1 each by Does not apply route as directed. Use as instructed to  check blood sugar once daily 100 each 0   No facility-administered medications prior to visit.     Per HPI unless specifically indicated in ROS section below Review of Systems  Constitutional: Negative for activity change, appetite change, chills, fatigue, fever and unexpected weight change.  HENT: Negative for hearing loss.   Eyes: Negative for visual disturbance.  Respiratory: Negative for cough, chest tightness, shortness of breath and wheezing.   Cardiovascular: Negative for chest pain, palpitations and leg swelling.  Gastrointestinal: Negative for abdominal distention, abdominal pain, blood in stool, constipation, diarrhea, nausea and vomiting.  Genitourinary: Negative for difficulty urinating and hematuria.  Musculoskeletal: Negative for  arthralgias, myalgias and neck pain.  Skin: Negative for rash.  Neurological: Negative for dizziness, seizures, syncope and headaches.  Hematological: Negative for adenopathy. Does not bruise/bleed easily.  Psychiatric/Behavioral: Negative for dysphoric mood. The patient is not nervous/anxious.    Objective:  BP 124/80   Pulse 80   Temp (!) 97.5 F (36.4 C) (Temporal)   Ht 5' 11.5" (1.816 m)   Wt 225 lb (102.1 kg)   SpO2 96%   BMI 30.94 kg/m   Wt Readings from Last 3 Encounters:  06/11/20 225 lb (102.1 kg)  02/29/20 218 lb 7 oz (99.1 kg)  11/29/19 212 lb 9 oz (96.4 kg)      Physical Exam Vitals and nursing note reviewed.  Constitutional:      General: He is not in acute distress.    Appearance: Normal appearance. He is well-developed and well-nourished. He is not ill-appearing.  HENT:     Head: Normocephalic and atraumatic.     Right Ear: Hearing, tympanic membrane, ear canal and external ear normal.     Left Ear: Hearing, tympanic membrane, ear canal and external ear normal.     Mouth/Throat:     Mouth: Oropharynx is clear and moist and mucous membranes are normal.     Pharynx: No posterior oropharyngeal edema.  Eyes:     General: No scleral icterus.    Extraocular Movements: Extraocular movements intact and EOM normal.     Conjunctiva/sclera: Conjunctivae normal.     Pupils: Pupils are equal, round, and reactive to light.  Neck:     Thyroid: No thyroid mass or thyromegaly.  Cardiovascular:     Rate and Rhythm: Normal rate and regular rhythm.     Pulses: Normal pulses and intact distal pulses.          Radial pulses are 2+ on the right side and 2+ on the left side.     Heart sounds: Normal heart sounds. No murmur heard.   Pulmonary:     Effort: Pulmonary effort is normal. No respiratory distress.     Breath sounds: Normal breath sounds. No wheezing, rhonchi or rales.  Abdominal:     General: Abdomen is flat. Bowel sounds are normal. There is no distension.      Palpations: Abdomen is soft. There is no mass.     Tenderness: There is no abdominal tenderness. There is no guarding or rebound.     Hernia: No hernia is present.     Comments: omnipod present on left abdomen  Musculoskeletal:        General: No edema. Normal range of motion.     Cervical back: Normal range of motion and neck supple.     Right lower leg: No edema.     Left lower leg: No edema.  Lymphadenopathy:     Cervical: No cervical adenopathy.  Skin:    General: Skin is warm and dry.     Findings: No rash.  Neurological:     General: No focal deficit present.     Mental Status: He is alert and oriented to person, place, and time.     Comments: CN grossly intact, station and gait intact  Psychiatric:        Mood and Affect: Mood and affect and mood normal.        Behavior: Behavior normal.        Thought Content: Thought content normal.        Judgment: Judgment normal.       Results for orders placed or performed in visit on 06/04/20  Hemoglobin A1c  Result Value Ref Range   Hgb A1c MFr Bld 7.3 (H) 4.6 - 6.5 %  Comprehensive metabolic panel  Result Value Ref Range   Sodium 137 135 - 145 mEq/L   Potassium 4.3 3.5 - 5.1 mEq/L   Chloride 103 96 - 112 mEq/L   CO2 26 19 - 32 mEq/L   Glucose, Bld 239 (H) 70 - 99 mg/dL   BUN 20 6 - 23 mg/dL   Creatinine, Ser 1.32 0.40 - 1.50 mg/dL   Total Bilirubin 1.1 0.2 - 1.2 mg/dL   Alkaline Phosphatase 72 39 - 117 U/L   AST 17 0 - 37 U/L   ALT 25 0 - 53 U/L   Total Protein 6.7 6.0 - 8.3 g/dL   Albumin 4.2 3.5 - 5.2 g/dL   GFR 44.01 >02.72 mL/min   Calcium 9.1 8.4 - 10.5 mg/dL  Lipid panel  Result Value Ref Range   Cholesterol 165 0 - 200 mg/dL   Triglycerides 536.6 0.0 - 149.0 mg/dL   HDL 44.03 >47.42 mg/dL   VLDL 59.5 0.0 - 63.8 mg/dL   LDL Cholesterol 92 0 - 99 mg/dL   Total CHOL/HDL Ratio 4    NonHDL 120.39   Microalbumin / creatinine urine ratio  Result Value Ref Range   Microalb, Ur 0.9 0.0 - 1.9 mg/dL    Creatinine,U 756.4 mg/dL   Microalb Creat Ratio 0.4 0.0 - 30.0 mg/g   Assessment & Plan:  This visit occurred during the SARS-CoV-2 public health emergency.  Safety protocols were in place, including screening questions prior to the visit, additional usage of staff PPE, and extensive cleaning of exam room while observing appropriate contact time as indicated for disinfecting solutions.   Problem List Items Addressed This Visit    Type 2 diabetes mellitus with other specified complication (HCC)    Chronic, improving now on insulin pump started 03/2020.  Appreciate endo care.       Relevant Medications   insulin aspart (NOVOLOG) 100 UNIT/ML injection   Hyperlipidemia associated with type 2 diabetes mellitus (HCC)    Chronic, stable on atorvastatin - continue. The 10-year ASCVD risk score Denman Herb DC Montez Hageman., et al., 2013) is: 1.6%   Values used to calculate the score:     Age: 83 years     Sex: Male     Is Non-Hispanic African American: No     Diabetic: Yes     Tobacco smoker: No     Systolic Blood Pressure: 124 mmHg     Is BP treated: No     HDL Cholesterol: 44.4 mg/dL     Total Cholesterol: 165 mg/dL       Relevant Medications   insulin aspart (NOVOLOG) 100 UNIT/ML injection   Health maintenance examination -  Primary    Preventative protocols reviewed and updated unless pt declined. Discussed healthy diet and lifestyle.           No orders of the defined types were placed in this encounter.  No orders of the defined types were placed in this encounter.   Patient instructions: You are doing well today  I'm glad sugar levels are getting better on insulin pump  Return as needed or in 6 months for diabetes follow up visit.  Follow up plan: Return in about 6 months (around 12/09/2020) for follow up visit.  Eustaquio Boyden, MD

## 2020-06-11 NOTE — Assessment & Plan Note (Signed)
Chronic, stable on atorvastatin - continue. The 10-year ASCVD risk score Javier Mccoy DC Montez Hageman., et al., 2013) is: 1.6%   Values used to calculate the score:     Age: 40 years     Sex: Male     Is Non-Hispanic African American: No     Diabetic: Yes     Tobacco smoker: No     Systolic Blood Pressure: 124 mmHg     Is BP treated: No     HDL Cholesterol: 44.4 mg/dL     Total Cholesterol: 165 mg/dL

## 2020-06-11 NOTE — Assessment & Plan Note (Signed)
Chronic, improving now on insulin pump started 03/2020.  Appreciate endo care.

## 2020-06-11 NOTE — Assessment & Plan Note (Signed)
Preventative protocols reviewed and updated unless pt declined. Discussed healthy diet and lifestyle.  

## 2020-08-22 ENCOUNTER — Encounter: Payer: Self-pay | Admitting: Family Medicine

## 2020-08-22 MED ORDER — ATENOLOL 50 MG PO TABS: 50.0000 mg | ORAL_TABLET | Freq: Every day | ORAL | 0 refills | Status: DC

## 2020-08-22 MED ORDER — ATORVASTATIN CALCIUM 40 MG PO TABS: 1.0000 | ORAL_TABLET | Freq: Every day | ORAL | 0 refills | Status: DC

## 2020-08-22 NOTE — Telephone Encounter (Signed)
E-scribed 30-day refills to local pharmacy.

## 2020-08-25 ENCOUNTER — Other Ambulatory Visit: Payer: Self-pay

## 2020-08-25 ENCOUNTER — Encounter: Payer: Self-pay | Admitting: Family Medicine

## 2020-08-25 ENCOUNTER — Telehealth (INDEPENDENT_AMBULATORY_CARE_PROVIDER_SITE_OTHER): Payer: BC Managed Care – PPO | Admitting: Family Medicine

## 2020-08-25 VITALS — HR 88 | Temp 99.1°F | Wt 218.0 lb

## 2020-08-25 DIAGNOSIS — Z794 Long term (current) use of insulin: Secondary | ICD-10-CM | POA: Diagnosis not present

## 2020-08-25 DIAGNOSIS — E1169 Type 2 diabetes mellitus with other specified complication: Secondary | ICD-10-CM | POA: Diagnosis not present

## 2020-08-25 DIAGNOSIS — U071 COVID-19: Secondary | ICD-10-CM | POA: Diagnosis not present

## 2020-08-25 MED ORDER — CHERATUSSIN AC 100-10 MG/5ML PO SOLN: 5.0000 mL | Freq: Three times a day (TID) | ORAL | 0 refills | Status: DC | PRN

## 2020-08-25 MED ORDER — NIRMATRELVIR/RITONAVIR (PAXLOVID)TABLET: 3.0000 | ORAL_TABLET | Freq: Two times a day (BID) | ORAL | 0 refills | Status: AC

## 2020-08-25 NOTE — Progress Notes (Signed)
Patient ID: Javier Mccoy, male    DOB: 12/10/1980, 40 y.o.   MRN: [id number]  Virtual visit completed through MyChart, a video enabled telemedicine application. Due to national recommendations of social distancing due to COVID-19, a virtual visit is felt to be most appropriate for this patient at this time. Reviewed limitations, risks, security and privacy concerns of performing a virtual visit and the availability of in person appointments. I also reviewed that there may be a patient responsible charge related to this service. The patient agreed to proceed.   Patient location: home Provider location: [place] at [facility], office Persons participating in this virtual visit: patient, provider   If any vitals were documented, they were collected by patient at home unless specified below.    Pulse 88   Temp 99.1 F (37.3 C)   Wt 218 lb (98.9 kg)   BMI 29.98 kg/m    CC: COVID positive Subjective:   HPI: Javier Mccoy is a 41 y.o. male presenting on 08/25/2020 for Cough (C/o cough, fever- max 101, nasal congestion, drainage and HA.  Sxs started 08/21/20 sore throat.  Positive home COVID test result on 08/22/20.  Pt vaccinated + booster.  Tried Tessalon perles, plain Mucinex and Tussin DM, barely helpful. )   First day of symptoms - 08/21/2020. Tested positive for covid 08/21/2020 COVID Pfizer vaccine x2, booster x1 (03/2020)  Current symptoms include fever Tmax 101, cough, nasal congestion, PNdrainage, ST, HA. Predominant sinus congestion to upper frontal sinuses. Dry cough - constant tickle in back of throat.   He's been treating with zyrtec, flonase nasal spray, mucinex, diabetic tussin DM, and tessalon perls and cough drops. Nothing is helping cough. Also taking vit C, vit D and zinc supplements.   Rest of family not affected. He is isolating from them.      Relevant past medical, surgical, family and social history reviewed and updated as indicated. Interim medical history  since our last visit reviewed. Allergies and medications reviewed and updated. Outpatient Medications Prior to Visit  Medication Sig Dispense Refill  . atenolol (TENORMIN) 50 MG tablet Take 1 tablet (50 mg total) by mouth daily. 30 tablet 0  . atorvastatin (LIPITOR) 40 MG tablet Take 1 tablet (40 mg total) by mouth daily. 30 tablet 0  . cetirizine (ZYRTEC) 10 MG tablet Take 10 mg by mouth daily.    . Continuous Blood Gluc Receiver (FREESTYLE LIBRE 14 DAY READER) DEVI by Does not apply route.    . Continuous Blood Gluc Sensor (FREESTYLE LIBRE 14 DAY SENSOR) MISC by Does not apply route.    . diphenoxylate-atropine (LOMOTIL) 2.5-0.025 MG tablet Take 2 tablets by mouth 4 (four) times daily as needed for diarrhea or loose stools. 30 tablet 0  . ibuprofen (ADVIL,MOTRIN) 800 MG tablet Take 800 mg by mouth every 8 (eight) hours as needed.    . insulin aspart (NOVOLOG) 100 UNIT/ML injection To use with pump    . Insulin Disposable Pump (OMNIPOD DASH 5 PACK PODS) MISC Inject into the skin.    . Insulin Pen Needle (PEN NEEDLES) 31G X 5 MM MISC Inject 1 pen into the skin 3 (three) times daily. 300 each 3  . metFORMIN (GLUCOPHAGE XR) 750 MG 24 hr tablet Take 1 tablet (750 mg total) by mouth 2 (two) times daily. 180 tablet 3  . metFORMIN (GLUCOPHAGE) 500 MG tablet Take 1 tablet (500 mg total) by mouth with breakfast, with lunch, and with evening meal. 270 tablet 1  .  ondansetron (ZOFRAN) 4 MG tablet Take 1 tablet (4 mg total) by mouth every 6 (six) hours. (Patient taking differently: Take 4 mg by mouth every 6 (six) hours. As needed) 30 tablet 0   No facility-administered medications prior to visit.     Per HPI unless specifically indicated in ROS section below Review of Systems Objective:  Pulse 88   Temp 99.1 F (37.3 C)   Wt 218 lb (98.9 kg)   BMI 29.98 kg/m   Wt Readings from Last 3 Encounters:  08/25/20 218 lb (98.9 kg)  06/11/20 225 lb (102.1 kg)  02/29/20 218 lb 7 oz (99.1 kg)        Physical exam: Gen: alert, NAD, not ill appearing Pulm: speaks in complete sentences without increased work of breathing Psych: normal mood, normal thought content      Results for orders placed or performed in visit on 06/04/20  Hemoglobin A1c  Result Value Ref Range   Hgb A1c MFr Bld 7.3 (H) 4.6 - 6.5 %  Comprehensive metabolic panel  Result Value Ref Range   Sodium 137 135 - 145 mEq/L   Potassium 4.3 3.5 - 5.1 mEq/L   Chloride 103 96 - 112 mEq/L   CO2 26 19 - 32 mEq/L   Glucose, Bld 239 (H) 70 - 99 mg/dL   BUN 20 6 - 23 mg/dL   Creatinine, Ser 6.28 0.40 - 1.50 mg/dL   Total Bilirubin 1.1 0.2 - 1.2 mg/dL   Alkaline Phosphatase 72 39 - 117 U/L   AST 17 0 - 37 U/L   ALT 25 0 - 53 U/L   Total Protein 6.7 6.0 - 8.3 g/dL   Albumin 4.2 3.5 - 5.2 g/dL   GFR 31.51 >76.16 mL/min   Calcium 9.1 8.4 - 10.5 mg/dL  Lipid panel  Result Value Ref Range   Cholesterol 165 0 - 200 mg/dL   Triglycerides 073.7 0.0 - 149.0 mg/dL   HDL 10.62 >69.48 mg/dL   VLDL 54.6 0.0 - 27.0 mg/dL   LDL Cholesterol 92 0 - 99 mg/dL   Total CHOL/HDL Ratio 4    NonHDL 120.39   Microalbumin / creatinine urine ratio  Result Value Ref Range   Microalb, Ur 0.9 0.0 - 1.9 mg/dL   Creatinine,U 350.0 mg/dL   Microalb Creat Ratio 0.4 0.0 - 30.0 mg/g   Assessment & Plan:   Problem List Items Addressed This Visit    Type 2 diabetes mellitus with other specified complication (HCC)   COVID-19 virus infection - Primary    Positive COVID symptoms started 4d ago. Diabetic.  In window for antiviral - paxlovid full dose sent to pharmacy.  Reviewed how to take, recommend hold atorvastatin for 5 days while on paxlovid.  Update if not improving as expected. Pt agrees with plan.       Relevant Medications   nirmatrelvir/ritonavir EUA (PAXLOVID) TABS       Meds ordered this encounter  Medications  . nirmatrelvir/ritonavir EUA (PAXLOVID) TABS    Sig: Take 3 tablets by mouth 2 (two) times daily for 5 days. Patient  GFR is 96. Take nirmatrelvir (150 mg) 2 tablet(s) twice daily for 5 days and ritonavir (100 mg) one tablet twice daily for 5 days.    Dispense:  30 tablet    Refill:  0  . DISCONTD: guaiFENesin-codeine (CHERATUSSIN AC) 100-10 MG/5ML syrup    Sig: Take 5 mLs by mouth 3 (three) times daily as needed for cough (sedation precautions).  Dispense:  120 mL    Refill:  0  . DISCONTD: guaiFENesin-codeine (CHERATUSSIN AC) 100-10 MG/5ML syrup    Sig: Take 5 mLs by mouth 3 (three) times daily as needed for cough (sedation precautions).    Dispense:  120 mL    Refill:  0  . guaiFENesin-codeine (CHERATUSSIN AC) 100-10 MG/5ML syrup    Sig: Take 5 mLs by mouth 3 (three) times daily as needed for cough (sedation precautions).    Dispense:  120 mL    Refill:  0   No orders of the defined types were placed in this encounter.   I discussed the assessment and treatment plan with the patient. The patient was provided an opportunity to ask questions and all were answered. The patient agreed with the plan and demonstrated an understanding of the instructions. The patient was advised to call back or seek an in-person evaluation if the symptoms worsen or if the condition fails to improve as anticipated.  Follow up plan: No follow-ups on file.  Eustaquio Boyden, MD

## 2020-08-25 NOTE — Telephone Encounter (Signed)
Spoke with pt asking about message.  States he tested COVID+ on 08/22/20 on a home test.  C/o cough, fever- max 101, sinus pain/pressure, HA, postnasal drip and sore throat.  Sxs started on 08/21/20 with sore throat.  Tried Tessalon perles, Mucinex and Tussin DM- barely helpful.  Pt requesting something to help with cough.   Pt agrees to MyChart video visit today at 4:00, due to Dr. Reece Agar having a cancellation.

## 2020-08-25 NOTE — Assessment & Plan Note (Addendum)
Positive COVID symptoms started 4d ago. Diabetic.  In window for antiviral - paxlovid full dose sent to pharmacy.  Reviewed how to take, recommend hold atorvastatin for 5 days while on paxlovid.  Update if not improving as expected. Pt agrees with plan.

## 2020-08-26 ENCOUNTER — Telehealth: Payer: Self-pay

## 2020-08-26 NOTE — Telephone Encounter (Signed)
Spoke to pharmacist - they figured it out (dosing was in sig) - pt already picked med up.

## 2020-08-26 NOTE — Telephone Encounter (Signed)
Received faxed message from [facility] stating they need a new rx sent with a strength for Paxlovid.

## 2020-09-19 ENCOUNTER — Other Ambulatory Visit: Payer: Self-pay | Admitting: Family Medicine

## 2020-11-26 ENCOUNTER — Encounter: Payer: Self-pay | Admitting: Family Medicine

## 2020-11-26 ENCOUNTER — Ambulatory Visit: Payer: BC Managed Care – PPO | Admitting: Family Medicine

## 2020-11-26 ENCOUNTER — Other Ambulatory Visit: Payer: Self-pay

## 2020-11-26 VITALS — BP 118/80 | HR 77 | Temp 97.9°F | Ht 71.5 in | Wt 218.0 lb

## 2020-11-26 DIAGNOSIS — I471 Supraventricular tachycardia, unspecified: Secondary | ICD-10-CM

## 2020-11-26 DIAGNOSIS — E1169 Type 2 diabetes mellitus with other specified complication: Secondary | ICD-10-CM

## 2020-11-26 DIAGNOSIS — Z794 Long term (current) use of insulin: Secondary | ICD-10-CM | POA: Diagnosis not present

## 2020-11-26 LAB — POCT GLYCOSYLATED HEMOGLOBIN (HGB A1C): Hemoglobin A1C: 6.8 % — AB (ref 4.0–5.6)

## 2020-11-26 MED ORDER — IBUPROFEN 800 MG PO TABS: 800.0000 mg | ORAL_TABLET | Freq: Three times a day (TID) | ORAL | 1 refills | Status: DC | PRN

## 2020-11-26 MED ORDER — METFORMIN HCL 500 MG PO TABS: 500.0000 mg | ORAL_TABLET | Freq: Three times a day (TID) | ORAL | 1 refills | Status: DC

## 2020-11-26 NOTE — Progress Notes (Signed)
Patient ID: Claron Hornbrook III, male    DOB: 1980-09-11, 40 y.o.   MRN: [id number]  This visit was conducted in person.  BP 118/80   Pulse 77   Temp 97.9 F (36.6 C) (Temporal)   Ht 5' 11.5" (1.816 m)   Wt 218 lb (98.9 kg)   SpO2 96%   BMI 29.98 kg/m    CC: DM f/u visit  Subjective:   HPI: Jock Monteith III is a 40 y.o. male presenting on 11/26/2020 for Diabetes (Here for 6 mo f/u.  Wants to discuss meds. )   Requests ibuprofen refill - takes infrequently PRN body aches.   DM - does regularly check sugars 100s. Seeing [facility] Endo for insulin pump management - on 1:4 ICR, omnipod pump. Compliant with antihyperglycemic regimen which includes: novolog U-100 (pump), metformin XR 750mg  bid (was using IR 500mg  TID due to recall). Did not tolerate GLP1R (GI upset). Denies hypoglycemic symptoms, occasional hypoglycemia 50-60s near meal times. Does have glucose tablets on hand. Denies paresthesias. Last diabetic eye exam DUE - upcoming appointment. Glucometer brand: . Last foot exam: 11/2019. DSME: completed remotely in [employer].  Lab Results  Component Value Date   HGBA1C 6.8 (A) 11/26/2020   Diabetic Foot Exam - Simple   Simple Foot Form Diabetic Foot exam was performed with the following findings: Yes 11/26/2020  9:19 AM  Visual Inspection No deformities, no ulcerations, no other skin breakdown bilaterally: Yes Sensation Testing Intact to touch and monofilament testing bilaterally: Yes Pulse Check Posterior Tibialis and Dorsalis pulse intact bilaterally: Yes Comments    Lab Results  Component Value Date   MICROALBUR 0.9 06/04/2020        Relevant past medical, surgical, family and social history reviewed and updated as indicated. Interim medical history since our last visit reviewed. Allergies and medications reviewed and updated. Outpatient Medications Prior to Visit  Medication Sig Dispense Refill   atenolol (TENORMIN) 50 MG tablet TAKE 1 TABLET BY MOUTH  EVERY DAY 90 tablet 2   atorvastatin (LIPITOR) 40 MG tablet TAKE 1 TABLET BY MOUTH EVERY DAY 90 tablet 2   cetirizine (ZYRTEC) 10 MG tablet Take 10 mg by mouth daily.     Continuous Blood Gluc Receiver (FREESTYLE LIBRE 14 DAY READER) DEVI by Does not apply route.     Continuous Blood Gluc Sensor (FREESTYLE LIBRE 14 DAY SENSOR) MISC by Does not apply route.     diphenoxylate-atropine (LOMOTIL) 2.5-0.025 MG tablet Take 2 tablets by mouth 4 (four) times daily as needed for diarrhea or loose stools. 30 tablet 0   insulin aspart (NOVOLOG) 100 UNIT/ML injection To use with pump     Insulin Disposable Pump (OMNIPOD DASH 5 PACK PODS) MISC Inject into the skin.     Insulin Pen Needle (PEN NEEDLES) 31G X 5 MM MISC Inject 1 pen into the skin 3 (three) times daily. 300 each 3   ondansetron (ZOFRAN) 4 MG tablet Take 1 tablet (4 mg total) by mouth every 6 (six) hours. (Patient taking differently: Take 4 mg by mouth every 6 (six) hours. As needed) 30 tablet 0   ibuprofen (ADVIL,MOTRIN) 800 MG tablet Take 800 mg by mouth every 8 (eight) hours as needed.     metFORMIN (GLUCOPHAGE) 500 MG tablet Take 1 tablet (500 mg total) by mouth with breakfast, with lunch, and with evening meal. 270 tablet 1   guaiFENesin-codeine (CHERATUSSIN AC) 100-10 MG/5ML syrup Take 5 mLs by mouth 3 (three) times daily as  needed for cough (sedation precautions). 120 mL 0   metFORMIN (GLUCOPHAGE XR) 750 MG 24 hr tablet Take 1 tablet (750 mg total) by mouth 2 (two) times daily. (Patient not taking: Reported on 11/26/2020) 180 tablet 3   No facility-administered medications prior to visit.     Per HPI unless specifically indicated in ROS section below Review of Systems  Objective:  BP 118/80   Pulse 77   Temp 97.9 F (36.6 C) (Temporal)   Ht 5' 11.5" (1.816 m)   Wt 218 lb (98.9 kg)   SpO2 96%   BMI 29.98 kg/m   Wt Readings from Last 3 Encounters:  11/26/20 218 lb (98.9 kg)  08/25/20 218 lb (98.9 kg)  06/11/20 225 lb (102.1 kg)       Physical Exam Vitals and nursing note reviewed.  Constitutional:      Appearance: Normal appearance. He is not ill-appearing.  Eyes:     Extraocular Movements: Extraocular movements intact.     Conjunctiva/sclera: Conjunctivae normal.     Pupils: Pupils are equal, round, and reactive to light.  Cardiovascular:     Rate and Rhythm: Normal rate and regular rhythm.     Pulses: Normal pulses.     Heart sounds: Normal heart sounds. No murmur heard. Pulmonary:     Effort: Pulmonary effort is normal. No respiratory distress.     Breath sounds: Normal breath sounds. No wheezing, rhonchi or rales.  Musculoskeletal:     Right lower leg: No edema.     Left lower leg: No edema.     Comments: See HPI for foot exam if done  Skin:    General: Skin is warm and dry.     Findings: No rash.  Neurological:     Mental Status: He is alert.  Psychiatric:        Mood and Affect: Mood normal.        Behavior: Behavior normal.      Results for orders placed or performed in visit on 11/26/20  POCT glycosylated hemoglobin (Hb A1C)  Result Value Ref Range   Hemoglobin A1C 6.8 (A) 4.0 - 5.6 %   HbA1c POC (<> result, manual entry)     HbA1c, POC (prediabetic range)     HbA1c, POC (controlled diabetic range)      Assessment & Plan:  This visit occurred during the SARS-CoV-2 public health emergency.  Safety protocols were in place, including screening questions prior to the visit, additional usage of staff PPE, and extensive cleaning of exam room while observing appropriate contact time as indicated for disinfecting solutions.   Problem List Items Addressed This Visit     Type 2 diabetes mellitus with other specified complication (HCC) - Primary    Chronic, well controlled based on A1c.  Continue metformin IR through our office and insulin pump through endo.  Foot exam today Upcoming eye exam later this month.  RTC 6 mo CPE.        Relevant Medications   metFORMIN (GLUCOPHAGE) 500 MG tablet    Other Relevant Orders   POCT glycosylated hemoglobin (Hb A1C) (Completed)   SVT (supraventricular tachycardia) (HCC)    Chronic, stable on atenolol 50mg  daily.         Meds ordered this encounter  Medications   ibuprofen (ADVIL) 800 MG tablet    Sig: Take 1 tablet (800 mg total) by mouth every 8 (eight) hours as needed.    Dispense:  30 tablet    Refill:  1   metFORMIN (GLUCOPHAGE) 500 MG tablet    Sig: Take 1 tablet (500 mg total) by mouth with breakfast, with lunch, and with evening meal.    Dispense:  270 tablet    Refill:  1    Orders Placed This Encounter  Procedures   POCT glycosylated hemoglobin (Hb A1C)     Patient Instructions  Foot exam today Have eye doctor send me report of upcoming eye exam (fax# [phone]).  Return as needed or in 6 months for physical.   Follow up plan: Return in about 6 months (around 05/29/2021) for annual exam, prior fasting for blood work.  Eustaquio Boyden, MD

## 2020-11-26 NOTE — Assessment & Plan Note (Signed)
Chronic, well controlled based on A1c.  Continue metformin IR through our office and insulin pump through endo.  Foot exam today Upcoming eye exam later this month.  RTC 6 mo CPE.

## 2020-11-26 NOTE — Patient Instructions (Addendum)
Foot exam today Have eye doctor send me report of upcoming eye exam (fax# [phone]).  Return as needed or in 6 months for physical.

## 2020-11-26 NOTE — Assessment & Plan Note (Signed)
Chronic, stable on atenolol 50mg  daily.

## 2020-12-16 LAB — HM DIABETES EYE EXAM

## 2021-01-30 ENCOUNTER — Encounter: Payer: Self-pay | Admitting: Family Medicine

## 2021-02-27 ENCOUNTER — Other Ambulatory Visit: Payer: Self-pay | Admitting: Family Medicine

## 2021-05-23 ENCOUNTER — Other Ambulatory Visit: Payer: Self-pay | Admitting: Family Medicine

## 2021-05-23 DIAGNOSIS — E785 Hyperlipidemia, unspecified: Secondary | ICD-10-CM

## 2021-05-23 DIAGNOSIS — E1169 Type 2 diabetes mellitus with other specified complication: Secondary | ICD-10-CM

## 2021-05-23 DIAGNOSIS — Z1159 Encounter for screening for other viral diseases: Secondary | ICD-10-CM

## 2021-05-23 DIAGNOSIS — Z794 Long term (current) use of insulin: Secondary | ICD-10-CM

## 2021-05-28 ENCOUNTER — Other Ambulatory Visit (INDEPENDENT_AMBULATORY_CARE_PROVIDER_SITE_OTHER): Payer: BC Managed Care – PPO

## 2021-05-28 ENCOUNTER — Other Ambulatory Visit: Payer: Self-pay

## 2021-05-28 DIAGNOSIS — E785 Hyperlipidemia, unspecified: Secondary | ICD-10-CM

## 2021-05-28 DIAGNOSIS — Z794 Long term (current) use of insulin: Secondary | ICD-10-CM | POA: Diagnosis not present

## 2021-05-28 DIAGNOSIS — Z1159 Encounter for screening for other viral diseases: Secondary | ICD-10-CM | POA: Diagnosis not present

## 2021-05-28 DIAGNOSIS — E1169 Type 2 diabetes mellitus with other specified complication: Secondary | ICD-10-CM

## 2021-05-28 LAB — LIPID PANEL
Cholesterol: 149 mg/dL (ref 0–200)
HDL: 43.3 mg/dL (ref >39.00)
LDL Cholesterol: 84 mg/dL (ref 0–99)
NonHDL: 106.11
Total CHOL/HDL Ratio: 3
Triglycerides: 112 mg/dL (ref 0.0–149.0)
VLDL: 22.4 mg/dL (ref 0.0–40.0)

## 2021-05-28 LAB — COMPREHENSIVE METABOLIC PANEL
ALT: 22 U/L (ref 0–53)
AST: 18 U/L (ref 0–37)
Albumin: 4.3 g/dL (ref 3.5–5.2)
Alkaline Phosphatase: 71 U/L (ref 39–117)
BUN: 18 mg/dL (ref 6–23)
CO2: 29 mEq/L (ref 19–32)
Calcium: 9.1 mg/dL (ref 8.4–10.5)
Chloride: 105 mEq/L (ref 96–112)
Creatinine, Ser: 0.97 mg/dL (ref 0.40–1.50)
GFR: 97.08 mL/min (ref >60.00)
Glucose, Bld: 116 mg/dL — ABNORMAL HIGH (ref 70–99)
Potassium: 4.1 mEq/L (ref 3.5–5.1)
Sodium: 141 mEq/L (ref 135–145)
Total Bilirubin: 0.8 mg/dL (ref 0.2–1.2)
Total Protein: 7 g/dL (ref 6.0–8.3)

## 2021-05-28 LAB — MICROALBUMIN / CREATININE URINE RATIO
Creatinine,U: 284.6 mg/dL
Microalb Creat Ratio: 0.7 mg/g (ref 0.0–30.0)
Microalb, Ur: 2 mg/dL — ABNORMAL HIGH (ref 0.0–1.9)

## 2021-05-28 LAB — HEMOGLOBIN A1C: Hgb A1c MFr Bld: 7 % — ABNORMAL HIGH (ref 4.6–6.5)

## 2021-05-29 LAB — HEPATITIS C ANTIBODY
Hepatitis C Ab: NONREACTIVE
SIGNAL TO CUT-OFF: <0.02 (ref <1.00)

## 2021-06-02 ENCOUNTER — Other Ambulatory Visit: Payer: Self-pay

## 2021-06-02 ENCOUNTER — Encounter: Payer: Self-pay | Admitting: Family Medicine

## 2021-06-02 ENCOUNTER — Ambulatory Visit (INDEPENDENT_AMBULATORY_CARE_PROVIDER_SITE_OTHER): Payer: BC Managed Care – PPO | Admitting: Family Medicine

## 2021-06-02 VITALS — BP 116/64 | HR 78 | Temp 97.4°F | Ht 71.75 in | Wt 229.1 lb

## 2021-06-02 DIAGNOSIS — M79671 Pain in right foot: Secondary | ICD-10-CM | POA: Insufficient documentation

## 2021-06-02 DIAGNOSIS — E1169 Type 2 diabetes mellitus with other specified complication: Secondary | ICD-10-CM | POA: Diagnosis not present

## 2021-06-02 DIAGNOSIS — Z23 Encounter for immunization: Secondary | ICD-10-CM

## 2021-06-02 DIAGNOSIS — E669 Obesity, unspecified: Secondary | ICD-10-CM

## 2021-06-02 DIAGNOSIS — Z0000 Encounter for general adult medical examination without abnormal findings: Secondary | ICD-10-CM

## 2021-06-02 DIAGNOSIS — Z794 Long term (current) use of insulin: Secondary | ICD-10-CM

## 2021-06-02 DIAGNOSIS — E785 Hyperlipidemia, unspecified: Secondary | ICD-10-CM

## 2021-06-02 DIAGNOSIS — E663 Overweight: Secondary | ICD-10-CM | POA: Insufficient documentation

## 2021-06-02 NOTE — Assessment & Plan Note (Addendum)
Weight gain noted. He is starting to implement increased aerobic exercises while recovering from recent injuries. Discussed healthy diet choices.

## 2021-06-02 NOTE — Assessment & Plan Note (Signed)
Preventative protocols reviewed and updated unless pt declined. Discussed healthy diet and lifestyle.  

## 2021-06-02 NOTE — Assessment & Plan Note (Signed)
Chronic, stable. Now seeing endo. Discussed healthy breakfast options. RTC 6 mo DM f/u visit

## 2021-06-02 NOTE — Assessment & Plan Note (Signed)
Developing discomfort at base of 5th MT on right - at site of prior fracture. ?developing arthritis. Update if worsening for xrays.

## 2021-06-02 NOTE — Assessment & Plan Note (Signed)
Chronic, stable on atorvastatin - continue. The 10-year ASCVD risk score (Arnett DK, et al., 2019) is: 1.4%   Values used to calculate the score:     Age: 41 years     Sex: Male     Is Non-Hispanic African American: No     Diabetic: Yes     Tobacco smoker: No     Systolic Blood Pressure: 116 mmHg     Is BP treated: No     HDL Cholesterol: 43.3 mg/dL     Total Cholesterol: 149 mg/dL

## 2021-06-02 NOTE — Patient Instructions (Addendum)
Flu shot today  You are doing well today  Return as needed or in 6 months for diabetes follow up.   Health Maintenance, Male Adopting a healthy lifestyle and getting preventive care are important in promoting health and wellness. Ask your health care provider about: The right schedule for you to have regular tests and exams. Things you can do on your own to prevent diseases and keep yourself healthy. What should I know about diet, weight, and exercise? Eat a healthy diet  Eat a diet that includes plenty of vegetables, fruits, low-fat dairy products, and lean protein. Do not eat a lot of foods that are high in solid fats, added sugars, or sodium. Maintain a healthy weight Body mass index (BMI) is a measurement that can be used to identify possible weight problems. It estimates body fat based on height and weight. Your health care provider can help determine your BMI and help you achieve or maintain a healthy weight. Get regular exercise Get regular exercise. This is one of the most important things you can do for your health. Most adults should: Exercise for at least 150 minutes each week. The exercise should increase your heart rate and make you sweat (moderate-intensity exercise). Do strengthening exercises at least twice a week. This is in addition to the moderate-intensity exercise. Spend less time sitting. Even light physical activity can be beneficial. Watch cholesterol and blood lipids Have your blood tested for lipids and cholesterol at 42 years of age, then have this test every 5 years. You may need to have your cholesterol levels checked more often if: Your lipid or cholesterol levels are high. You are older than 42 years of age. You are at high risk for heart disease. What should I know about cancer screening? Many types of cancers can be detected early and may often be prevented. Depending on your health history and family history, you may need to have cancer screening at  various ages. This may include screening for: Colorectal cancer. Prostate cancer. Skin cancer. Lung cancer. What should I know about heart disease, diabetes, and high blood pressure? Blood pressure and heart disease High blood pressure causes heart disease and increases the risk of stroke. This is more likely to develop in people who have high blood pressure readings or are overweight. Talk with your health care provider about your target blood pressure readings. Have your blood pressure checked: Every 3-5 years if you are 52-76 years of age. Every year if you are 42 years old or older. If you are between the ages of 79 and 63 and are a current or former smoker, ask your health care provider if you should have a one-time screening for abdominal aortic aneurysm (AAA). Diabetes Have regular diabetes screenings. This checks your fasting blood sugar level. Have the screening done: Once every three years after age 60 if you are at a normal weight and have a low risk for diabetes. More often and at a younger age if you are overweight or have a high risk for diabetes. What should I know about preventing infection? Hepatitis B If you have a higher risk for hepatitis B, you should be screened for this virus. Talk with your health care provider to find out if you are at risk for hepatitis B infection. Hepatitis C Blood testing is recommended for: Everyone born from [birth year] through 1965. Anyone with known risk factors for hepatitis C. Sexually transmitted infections (STIs) You should be screened each year for STIs, including gonorrhea and chlamydia,  if: You are sexually active and are younger than 42 years of age. You are older than 42 years of age and your health care provider tells you that you are at risk for this type of infection. Your sexual activity has changed since you were last screened, and you are at increased risk for chlamydia or gonorrhea. Ask your health care provider if you are at  risk. Ask your health care provider about whether you are at high risk for HIV. Your health care provider may recommend a prescription medicine to help prevent HIV infection. If you choose to take medicine to prevent HIV, you should first get tested for HIV. You should then be tested every 3 months for as long as you are taking the medicine. Follow these instructions at home: Alcohol use Do not drink alcohol if your health care provider tells you not to drink. If you drink alcohol: Limit how much you have to 0-2 drinks a day. Know how much alcohol is in your drink. In the [place] one drink equals one 12 oz bottle of beer (355 mL), one 5 oz glass of wine (148 mL), or one 1 oz glass of hard liquor (44 mL). Lifestyle Do not use any products that contain nicotine or tobacco. These products include cigarettes, chewing tobacco, and vaping devices, such as e-cigarettes. If you need help quitting, ask your health care provider. Do not use street drugs. Do not share needles. Ask your health care provider for help if you need support or information about quitting drugs. General instructions Schedule regular health, dental, and eye exams. Stay current with your vaccines. Tell your health care provider if: You often feel depressed. You have ever been abused or do not feel safe at home. Summary Adopting a healthy lifestyle and getting preventive care are important in promoting health and wellness. Follow your health care provider's instructions about healthy diet, exercising, and getting tested or screened for diseases. Follow your health care provider's instructions on monitoring your cholesterol and blood pressure. This information is not intended to replace advice given to you by your health care provider. Make sure you discuss any questions you have with your health care provider. Document Revised: 09/01/2020 Document Reviewed: 09/01/2020 Elsevier Patient Education  2022 [employer].

## 2021-06-02 NOTE — Progress Notes (Signed)
Patient ID: Javier Mccoy, male    DOB: Apr 16, 1981, 41 y.o.   MRN: [id number]  This visit was conducted in person.  BP 116/64    Pulse 78    Temp (!) 97.4 F (36.3 C) (Temporal)    Ht 5' 11.75" (1.822 m)    Wt 229 lb 1 oz (103.9 kg)    SpO2 95%    BMI 31.28 kg/m    CC: CPE Subjective:   HPI: Javier Mccoy is a 41 y.o. male presenting on 06/02/2021 for Annual Exam   DM - sees [facility] endocrinology for Omnipod insulin pump with novolog. Metformin XR changed to IR due to recall. continues this as well. Uses freestyle Libre 3 CGM.   Notes some discomfort to R base of 5th MT, at same site as he previously had fracture. R achilles heel tear finally healing. Has started increasing cardio (52min/day)    Preventative: Flu shot yearly [place] 06/2019 x2, booster 03/2020 Pneumovax 04/2016 Td 2014 Seat belt use discussed Sunscreen use discussed. No changing moles on skin.  Non smoker Alcohol - 2-3 wine glasses/wk Sleep -averages 7-9 hours/night  Dentist q6 mo Eye doctor yearly   Lives with husband Javier Mccoy) and 2 children  Occ: college administrator [employer]  Activity: 30 min cardio daily - recent achilles tear finally healed  Diet: good water, fruits/vegetables daily      Relevant past medical, surgical, family and social history reviewed and updated as indicated. Interim medical history since our last visit reviewed. Allergies and medications reviewed and updated. Outpatient Medications Prior to Visit  Medication Sig Dispense Refill   atenolol (TENORMIN) 50 MG tablet TAKE 1 TABLET BY MOUTH EVERY DAY 90 tablet 2   atorvastatin (LIPITOR) 40 MG tablet TAKE 1 TABLET BY MOUTH EVERY DAY 90 tablet 2   cetirizine (ZYRTEC) 10 MG tablet Take 10 mg by mouth daily.     Continuous Blood Gluc Receiver (FREESTYLE LIBRE 14 DAY READER) DEVI by Does not apply route.     Continuous Blood Gluc Sensor (FREESTYLE LIBRE 14 DAY SENSOR) MISC by Does not apply route.      diphenoxylate-atropine (LOMOTIL) 2.5-0.025 MG tablet Take 2 tablets by mouth 4 (four) times daily as needed for diarrhea or loose stools. 30 tablet 0   ibuprofen (ADVIL) 800 MG tablet Take 1 tablet (800 mg total) by mouth every 8 (eight) hours as needed. 30 tablet 1   insulin aspart (NOVOLOG) 100 UNIT/ML injection To use with pump     Insulin Disposable Pump (OMNIPOD DASH 5 PACK PODS) MISC Inject into the skin.     Insulin Pen Needle (PEN NEEDLES) 31G X 5 MM MISC Inject 1 pen into the skin 3 (three) times daily. 300 each 3   metFORMIN (GLUCOPHAGE) 500 MG tablet TAKE 1 TABLET WITH         BREAKFAST, WITH LUNCH, AND WITH EVENING MEAL. 270 tablet 1   ondansetron (ZOFRAN) 4 MG tablet Take 1 tablet (4 mg total) by mouth every 6 (six) hours. (Patient taking differently: Take 4 mg by mouth every 6 (six) hours. As needed) 30 tablet 0   No facility-administered medications prior to visit.     Per HPI unless specifically indicated in ROS section below Review of Systems  Constitutional:  Negative for activity change, appetite change, chills, fatigue, fever and unexpected weight change.  HENT:  Negative for hearing loss.   Eyes:  Negative for visual disturbance.  Respiratory:  Negative for cough,  chest tightness, shortness of breath and wheezing.   Cardiovascular:  Negative for chest pain, palpitations and leg swelling.  Gastrointestinal:  Negative for abdominal distention, abdominal pain, blood in stool, constipation, diarrhea, nausea and vomiting.  Genitourinary:  Negative for difficulty urinating and hematuria.  Musculoskeletal:  Negative for arthralgias, myalgias and neck pain.  Skin:  Negative for rash.  Neurological:  Negative for dizziness, seizures, syncope and headaches.  Hematological:  Negative for adenopathy. Does not bruise/bleed easily.  Psychiatric/Behavioral:  Negative for dysphoric mood. The patient is not nervous/anxious.    Objective:  BP 116/64    Pulse 78    Temp (!) 97.4 F (36.3  C) (Temporal)    Ht 5' 11.75" (1.822 m)    Wt 229 lb 1 oz (103.9 kg)    SpO2 95%    BMI 31.28 kg/m   Wt Readings from Last 3 Encounters:  06/02/21 229 lb 1 oz (103.9 kg)  11/26/20 218 lb (98.9 kg)  08/25/20 218 lb (98.9 kg)      Physical Exam Vitals and nursing note reviewed.  Constitutional:      General: He is not in acute distress.    Appearance: Normal appearance. He is well-developed. He is not ill-appearing.  HENT:     Head: Normocephalic and atraumatic.     Right Ear: Hearing, tympanic membrane, ear canal and external ear normal.     Left Ear: Hearing, tympanic membrane, ear canal and external ear normal.  Eyes:     General: No scleral icterus.    Extraocular Movements: Extraocular movements intact.     Conjunctiva/sclera: Conjunctivae normal.     Pupils: Pupils are equal, round, and reactive to light.  Neck:     Thyroid: No thyroid mass or thyromegaly.  Cardiovascular:     Rate and Rhythm: Normal rate and regular rhythm.     Pulses: Normal pulses.          Radial pulses are 2+ on the right side and 2+ on the left side.     Heart sounds: Normal heart sounds. No murmur heard. Pulmonary:     Effort: Pulmonary effort is normal. No respiratory distress.     Breath sounds: Normal breath sounds. No wheezing, rhonchi or rales.  Abdominal:     General: Bowel sounds are normal. There is no distension.     Palpations: Abdomen is soft. There is no mass.     Tenderness: There is no abdominal tenderness. There is no guarding or rebound.     Hernia: No hernia is present.  Musculoskeletal:        General: Normal range of motion.     Cervical back: Normal range of motion and neck supple.     Right lower leg: No edema.     Left lower leg: No edema.     Comments: No reproducible pain to palpation of R foot  Lymphadenopathy:     Cervical: No cervical adenopathy.  Skin:    General: Skin is warm and dry.     Findings: No rash.  Neurological:     General: No focal deficit present.      Mental Status: He is alert and oriented to person, place, and time.  Psychiatric:        Mood and Affect: Mood normal.        Behavior: Behavior normal.        Thought Content: Thought content normal.        Judgment: Judgment normal.  Results for orders placed or performed in visit on 05/28/21  Hepatitis C antibody  Result Value Ref Range   Hepatitis C Ab NON-REACTIVE NON-REACTIVE   SIGNAL TO CUT-OFF <0.02 <1.00  Microalbumin / creatinine urine ratio  Result Value Ref Range   Microalb, Ur 2.0 (H) 0.0 - 1.9 mg/dL   Creatinine,U 284.6 mg/dL   Microalb Creat Ratio 0.7 0.0 - 30.0 mg/g  Hemoglobin A1c  Result Value Ref Range   Hgb A1c MFr Bld 7.0 (H) 4.6 - 6.5 %  Comprehensive metabolic panel  Result Value Ref Range   Sodium 141 135 - 145 mEq/L   Potassium 4.1 3.5 - 5.1 mEq/L   Chloride 105 96 - 112 mEq/L   CO2 29 19 - 32 mEq/L   Glucose, Bld 116 (H) 70 - 99 mg/dL   BUN 18 6 - 23 mg/dL   Creatinine, Ser 0.97 0.40 - 1.50 mg/dL   Total Bilirubin 0.8 0.2 - 1.2 mg/dL   Alkaline Phosphatase 71 39 - 117 U/L   AST 18 0 - 37 U/L   ALT 22 0 - 53 U/L   Total Protein 7.0 6.0 - 8.3 g/dL   Albumin 4.3 3.5 - 5.2 g/dL   GFR 97.08 >60.00 mL/min   Calcium 9.1 8.4 - 10.5 mg/dL  Lipid panel  Result Value Ref Range   Cholesterol 149 0 - 200 mg/dL   Triglycerides 112.0 0.0 - 149.0 mg/dL   HDL 43.30 >39.00 mg/dL   VLDL 22.4 0.0 - 40.0 mg/dL   LDL Cholesterol 84 0 - 99 mg/dL   Total CHOL/HDL Ratio 3    NonHDL 106.11     Assessment & Plan:  This visit occurred during the SARS-CoV-2 public health emergency.  Safety protocols were in place, including screening questions prior to the visit, additional usage of staff PPE, and extensive cleaning of exam room while observing appropriate contact time as indicated for disinfecting solutions.   Problem List Items Addressed This Visit     Health maintenance examination - Primary (Chronic)    Preventative protocols reviewed and updated  unless pt declined. Discussed healthy diet and lifestyle.       Type 2 diabetes mellitus with other specified complication (HCC)    Chronic, stable. Now seeing endo. Discussed healthy breakfast options. RTC 6 mo DM f/u visit       Hyperlipidemia associated with type 2 diabetes mellitus (HCC)    Chronic, stable on atorvastatin - continue. The 10-year ASCVD risk score (Arnett DK, et al., 2019) is: 1.4%   Values used to calculate the score:     Age: 66 years     Sex: Male     Is Non-Hispanic African American: No     Diabetic: Yes     Tobacco smoker: No     Systolic Blood Pressure: 99991111 mmHg     Is BP treated: No     HDL Cholesterol: 43.3 mg/dL     Total Cholesterol: 149 mg/dL       Obesity, Class I, BMI 30-34.9    Weight gain noted. He is starting to implement increased aerobic exercises while recovering from recent injuries. Discussed healthy diet choices.       Right foot pain    Developing discomfort at base of 5th MT on right - at site of prior fracture. ?developing arthritis. Update if worsening for xrays.       Other Visit Diagnoses     Need for influenza vaccination  Relevant Orders   Flu Vaccine QUAD 81mo+IM (Fluarix, Fluzone & Alfiuria Quad PF) (Completed)        No orders of the defined types were placed in this encounter.  Orders Placed This Encounter  Procedures   Flu Vaccine QUAD 89mo+IM (Fluarix, Fluzone & Alfiuria Quad PF)    Patient instructions: Flu shot today  You are doing well today  Return as needed or in 6 months for diabetes follow up.   Follow up plan: Return in about 6 months (around 11/30/2021) for follow up visit.  Ria Bush, MD

## 2021-06-27 ENCOUNTER — Encounter: Payer: Self-pay | Admitting: Family Medicine

## 2021-06-27 ENCOUNTER — Other Ambulatory Visit: Payer: Self-pay | Admitting: Family Medicine

## 2021-06-29 ENCOUNTER — Encounter: Payer: Self-pay | Admitting: Family Medicine

## 2021-06-29 ENCOUNTER — Ambulatory Visit: Payer: BC Managed Care – PPO | Admitting: Family Medicine

## 2021-06-29 ENCOUNTER — Other Ambulatory Visit: Payer: Self-pay

## 2021-06-29 VITALS — BP 110/78 | HR 80 | Temp 97.6°F | Ht 71.75 in | Wt 228.2 lb

## 2021-06-29 DIAGNOSIS — M79673 Pain in unspecified foot: Secondary | ICD-10-CM | POA: Diagnosis not present

## 2021-06-29 DIAGNOSIS — M722 Plantar fascial fibromatosis: Secondary | ICD-10-CM

## 2021-06-29 DIAGNOSIS — M67979 Unspecified disorder of synovium and tendon, unspecified ankle and foot: Secondary | ICD-10-CM | POA: Diagnosis not present

## 2021-06-29 DIAGNOSIS — M216X9 Other acquired deformities of unspecified foot: Secondary | ICD-10-CM | POA: Diagnosis not present

## 2021-06-29 DIAGNOSIS — G8929 Other chronic pain: Secondary | ICD-10-CM

## 2021-06-29 MED ORDER — ATORVASTATIN CALCIUM 40 MG PO TABS: 40.0000 mg | ORAL_TABLET | Freq: Every day | ORAL | 3 refills | Status: DC

## 2021-06-29 NOTE — Progress Notes (Signed)
? ? ?Javier Koci [name], MD, [facility] ?Therapist, music at [facility] ?[place] ?[place], [address] ? ?Phone: [phone]  FAX: [phone] ? ?Javier Mccoy - 41 y.o. male  MRN [id number]  Date of Birth: [birth year] ? ?Date: 06/29/2021  PCP: Ria Bush, MD  Referral: Ria Bush, MD ? ?Chief Complaint  ?Patient presents with  ? Foot Pain  ?  Right  ? ? ?This visit occurred during the SARS-CoV-2 public health emergency.  Safety protocols were in place, including screening questions prior to the visit, additional usage of staff PPE, and extensive cleaning of exam room while observing appropriate contact time as indicated for disinfecting solutions.  ? ?Subjective:  ? ?Javier Mccoy is a 41 y.o. very pleasant male patient with Body mass index is 31.16 kg/m?. who presents with the following: ? ?R foot pain:   ? 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  At this point he also has pain medially, while his Achilles tendinopathy is dramatically improved over the course of years. ? ?Right now, he is not able to run, and he is had to change shoes quite often. ? ?He does have some lateral pain at a bunionette, and right now though however he is having pain medially along the arch as well as just inferior to the medial malleolus. ? ?Now with lateral pain, and now has pain on the bottom, medial, and all on the bottom.  ? ?Broken the 5th a couple of times.  ? ?Overuse PF medially ? ?Really bad over the weekend it got a lot worse ? ?Review of Systems is noted in the HPI, as appropriate ? ?Objective:  ? ?BP 110/78   Pulse 80   Temp 97.6 ?F (36.4 ?C) (Oral)   Ht 5' 11.75" (1.822 m)   Wt 228 lb 3 oz (103.5 kg)   SpO2 96%   BMI 31.16 kg/m?  ? ?GEN: No acute distress; alert,appropriate. ?PULM: Breathing comfortably in no respiratory distress ?PSYCH: Normally interactive.  ? ?Bilateral foot exam: Patient has extensive and  complete transverse arch collapse bilaterally.  There is some extensive toe splaying, bunionette formation, right greater than left.  He also has some early hammertoe formation.  Multiple metatarsal heads are dropped. ? ?Longitudinal arch is relatively preserved. ? ?There is some tenderness on the right side at the medial aspect of the plantar fascia and to a lesser extent along the posterior tibialis tendon. ? ?Additional bony anatomy is nontender. ? ?The patient walks with an outturned foot, right greater than left.  Dramatic pronation. ? ?Laboratory and Imaging Data: ? ?Assessment and Plan:  ? ?  ICD-10-CM   ?1. Pain, foot, chronic, unspecified laterality  M79.673   ? G89.29   ?  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  ?3. Loss of transverse plantar arch, unspecified laterality  M21.6X9   ?  ?4. Plantar fascia syndrome  M72.2   ?  ? ?Chronic foot pain, likely initial exacerbated from a long course of Achilles tendinopathy.  He walks with fairly extreme pronation and hindfoot breakdown with additional severe forefoot breakdown.  I think this is putting pressure on his medial aspect of his plantar fascia as well as the remainder of the supportive medial structures including the posterior tibialis tendon. ? ?I think it he does move better in his current shoes, however I do think a custom orthotic would provide some additional support to unload the  medial structures.  I am going to get him set up to do this in the office. ? ?Social: This is limiting his ability to exercise as he would like.  At baseline previously, he was able to run. ? ? ?Follow-up: Return for 40 minute orthotic appointment at 2 PM at his convenience. ? ?Dragon Medical One speech-to-text software was used for transcription in this dictation.  Possible transcriptional errors can occur using Editor, commissioning.  ? ?Signed, ? ?Breylin Dom T. Desiraye Rolfson, MD ? ? ?Outpatient Encounter Medications as of 06/29/2021  ?Medication Sig  ? atenolol (TENORMIN) 50  MG tablet TAKE 1 TABLET BY MOUTH EVERY DAY  ? atorvastatin (LIPITOR) 40 MG tablet Take 1 tablet (40 mg total) by mouth daily.  ? cetirizine (ZYRTEC) 10 MG tablet Take 10 mg by mouth daily.  ? Continuous Blood Gluc Receiver (FREESTYLE LIBRE 14 DAY READER) DEVI by Does not apply route.  ? Continuous Blood Gluc Sensor (FREESTYLE LIBRE 14 DAY SENSOR) MISC by Does not apply route.  ? diphenoxylate-atropine (LOMOTIL) 2.5-0.025 MG tablet Take 2 tablets by mouth 4 (four) times daily as needed for diarrhea or loose stools.  ? insulin aspart (NOVOLOG) 100 UNIT/ML injection To use with pump  ? Insulin Disposable Pump (OMNIPOD DASH 5 PACK PODS) MISC Inject into the skin.  ? Insulin Pen Needle (PEN NEEDLES) 31G X 5 MM MISC Inject 1 pen into the skin 3 (three) times daily.  ? metFORMIN (GLUCOPHAGE) 500 MG tablet TAKE 1 TABLET WITH         BREAKFAST, WITH LUNCH, AND WITH EVENING MEAL.  ? ondansetron (ZOFRAN) 4 MG tablet Take 1 tablet (4 mg total) by mouth every 6 (six) hours. (Patient taking differently: Take 4 mg by mouth every 6 (six) hours. As needed)  ? [DISCONTINUED] ibuprofen (ADVIL) 800 MG tablet Take 1 tablet (800 mg total) by mouth every 8 (eight) hours as needed.  ? ?No facility-administered encounter medications on file as of 06/29/2021.  ?  ?

## 2021-06-29 NOTE — Telephone Encounter (Signed)
Ibuprofen ?Last filled:  02/08/21, #30 ?Last OV:  06/02/21, CPE ?Next OV:  12/01/21, 6 mo DM f/u ?

## 2021-07-02 ENCOUNTER — Encounter: Payer: Self-pay | Admitting: Family Medicine

## 2021-07-02 ENCOUNTER — Other Ambulatory Visit: Payer: Self-pay

## 2021-07-02 ENCOUNTER — Ambulatory Visit: Payer: BC Managed Care – PPO | Admitting: Family Medicine

## 2021-07-02 VITALS — BP 112/70 | HR 77 | Temp 97.6°F | Ht 71.75 in | Wt 229.0 lb

## 2021-07-02 DIAGNOSIS — M79673 Pain in unspecified foot: Secondary | ICD-10-CM | POA: Diagnosis not present

## 2021-07-02 DIAGNOSIS — M67979 Unspecified disorder of synovium and tendon, unspecified ankle and foot: Secondary | ICD-10-CM

## 2021-07-02 DIAGNOSIS — G8929 Other chronic pain: Secondary | ICD-10-CM

## 2021-07-02 DIAGNOSIS — M722 Plantar fascial fibromatosis: Secondary | ICD-10-CM

## 2021-07-02 DIAGNOSIS — M216X9 Other acquired deformities of unspecified foot: Secondary | ICD-10-CM | POA: Diagnosis not present

## 2021-07-02 NOTE — Progress Notes (Signed)
? ? ?Javier Pitre T. Abdulkareem Badolato, MD, [facility] ?Nature conservation officer at [facility] ?[address] [place] ?[place], [address] ? ?Phone: [phone]  FAX: [phone] ? ?Javier Mccoy Mccoy - 41 y.o. male  MRN [id number]  Date of Birth: Jun 01, 1980 ? ?Date: 07/02/2021  PCP: Javier Mccoy Boyden, MD  Referral: Javier Mccoy Boyden, MD ? ?Chief Complaint  ?Patient presents with  ? Orthotics  ?  Pt is here to have orthotics made.  ? ? ?This visit occurred during the SARS-CoV-2 public health emergency.  Safety protocols were in place, including screening questions prior to the visit, additional usage of staff PPE, and extensive cleaning of exam room while observing appropriate contact time as indicated for disinfecting solutions.  ? ?Subjective:  ? ?Javier Mccoy Mccoy is a 41 y.o. very pleasant male patient with Body mass index is 31.27 kg/m?. who presents with the following: ? ?Chronic B foot pain: He is a very nice gentleman, and I saw him last week with some ongoing foot pain that is really been plaguing him for a long time.  He did have a very long course of some Achilles tendinopathy on the left side, now he is developed significant medial sided foot pain. ? ?Size 12 F4 ? ?Review of Systems is noted in the HPI, as appropriate ? ?Objective:  ? ?BP 112/70 (BP Location: Left Arm, Patient Position: Sitting, Cuff Size: Large)   Pulse 77   Temp 97.6 ?F (36.4 ?C)   Ht 5' 11.75" (1.822 m)   Wt 229 lb (103.9 kg)   SpO2 96%   BMI 31.27 kg/m?  ? ?GEN: No acute distress; alert,appropriate. ?PULM: Breathing comfortably in no respiratory distress ?PSYCH: Normally interactive.  ? ?Gait: The patient does walk with quite dramatic pronation in an outward turn gait.  Transverse arch complete collapse with early hammertoe formation and significant toe splaying and early bunionette formation. ? ?Laboratory and Imaging Data: ? ?Assessment and Plan:  ? ?  ICD-10-CM   ?1. Pain, foot, chronic, unspecified laterality  M79.673   ?  G89.29   ?  ?2. Loss of transverse plantar arch, unspecified laterality  M21.6X9   ?  ?3. Tibialis posterior tendinopathy  M67.979   ?  ?4. Plantar fascia syndrome  M72.2   ?  ? ?Total encounter time: 40 minutes. This includes total time spent on the day of encounter.  This was spent in custom crafting his orthotics by myself, and gait analysis.  She is shoes do look to be in good shape as well.  Orthotics look Market researcher. ? ?After production and in shoes, they feel immediately more comfortable, and with walking notable less pronation and out were turned gait.  I think that he will do well. ? ?Patient was fitted for a standard, cushioned, semi-rigid orthotic.  The orthotic was heated and the patient stood on the orthotic blank positioned on the orthotic stand. The patient was positioned in subtalar neutral position and 10 degrees of ankle dorsiflexion in a weight bearing stance. ?After molding, a stable Fast-Tech EVA base was applied to the orthotic blank.   ?The blank was ground to a stable position for weight bearing. ?size: 12 ?base: Black f4 ?posting: none ?additional orthotic padding: none  ? ?No orders of the defined types were placed in this encounter. ? ?There are no discontinued medications. ?No orders of the defined types were placed in this encounter. ? ? ?Follow-up: As needed ? ?Dragon Medical One speech-to-text software was used for transcription in this  dictation.  Possible transcriptional errors can occur using Animal nutritionist.  ? ?Signed, ? ?Javier Belfiore T. Aliena Ghrist, MD ? ? ?Outpatient Encounter Medications as of 07/02/2021  ?Medication Sig  ? atenolol (TENORMIN) 50 MG tablet TAKE 1 TABLET BY MOUTH EVERY DAY  ? atorvastatin (LIPITOR) 40 MG tablet Take 1 tablet (40 mg total) by mouth daily.  ? cetirizine (ZYRTEC) 10 MG tablet Take 10 mg by mouth daily.  ? Continuous Blood Gluc Receiver (FREESTYLE LIBRE 14 DAY READER) DEVI by Does not apply route.  ? Continuous Blood Gluc Sensor (FREESTYLE LIBRE 14  DAY SENSOR) MISC by Does not apply route.  ? diphenoxylate-atropine (LOMOTIL) 2.5-0.025 MG tablet Take 2 tablets by mouth 4 (four) times daily as needed for diarrhea or loose stools.  ? ibuprofen (ADVIL) 800 MG tablet TAKE 1 TABLET BY MOUTH EVERY 8 HOURS AS NEEDED  ? insulin aspart (NOVOLOG) 100 UNIT/ML injection To use with pump  ? Insulin Disposable Pump (OMNIPOD DASH 5 PACK PODS) MISC Inject into the skin.  ? Insulin Pen Needle (PEN NEEDLES) 31G X 5 MM MISC Inject 1 pen into the skin 3 (three) times daily.  ? metFORMIN (GLUCOPHAGE) 500 MG tablet TAKE 1 TABLET WITH         BREAKFAST, WITH LUNCH, AND WITH EVENING MEAL.  ? ondansetron (ZOFRAN) 4 MG tablet Take 1 tablet (4 mg total) by mouth every 6 (six) hours. (Patient taking differently: Take 4 mg by mouth every 6 (six) hours. As needed)  ? ?No facility-administered encounter medications on file as of 07/02/2021.  ?  ?

## 2021-07-03 ENCOUNTER — Encounter: Payer: Self-pay | Admitting: Family Medicine

## 2021-08-11 ENCOUNTER — Other Ambulatory Visit: Payer: Self-pay | Admitting: Family Medicine

## 2021-10-06 ENCOUNTER — Ambulatory Visit: Payer: BC Managed Care – PPO | Admitting: Internal Medicine

## 2021-10-06 ENCOUNTER — Encounter: Payer: Self-pay | Admitting: Internal Medicine

## 2021-10-06 VITALS — BP 114/80 | HR 76 | Temp 96.9°F | Ht 72.0 in | Wt 228.0 lb

## 2021-10-06 DIAGNOSIS — J029 Acute pharyngitis, unspecified: Secondary | ICD-10-CM | POA: Insufficient documentation

## 2021-10-06 LAB — POCT RAPID STREP A (OFFICE): Rapid Strep A Screen: NEGATIVE

## 2021-10-06 MED ORDER — AMOXICILLIN 500 MG PO TABS: 1000.0000 mg | ORAL_TABLET | Freq: Two times a day (BID) | ORAL | 0 refills | Status: DC

## 2021-10-06 NOTE — Assessment & Plan Note (Signed)
Non specific presentation but household contact to strep and some adenopathy Strep negative  Mild sinus symptoms--no clear bacterial infection Will treat due to symptoms and exposure. Will use amoxil 1000 bid x 10 days---just in case

## 2021-10-06 NOTE — Progress Notes (Signed)
Subjective:    Patient ID: Javier Mccoy, male    DOB: Feb 14, 1981, 41 y.o.   MRN: [id number]  HPI Here due to exposure to strep and some symptoms  16 year old daughter with lingering symptoms --but diagnosed with strep yesterday He has had 5 days of "swimmy headedness" Slight sore throat No significant fever No swallowing symptoms--but mild stomach discomfort (and loose stool) No cough Has had some mildly tender anterior cervical nodes  Current Outpatient Medications on File Prior to Visit  Medication Sig Dispense Refill   atenolol (TENORMIN) 50 MG tablet TAKE 1 TABLET DAILY 90 tablet 0   atorvastatin (LIPITOR) 40 MG tablet Take 1 tablet (40 mg total) by mouth daily. 90 tablet 3   cetirizine (ZYRTEC) 10 MG tablet Take 10 mg by mouth daily.     Continuous Blood Gluc Receiver (FREESTYLE LIBRE 14 DAY READER) DEVI by Does not apply route.     Continuous Blood Gluc Sensor (FREESTYLE LIBRE 14 DAY SENSOR) MISC by Does not apply route.     diphenoxylate-atropine (LOMOTIL) 2.5-0.025 MG tablet Take 2 tablets by mouth 4 (four) times daily as needed for diarrhea or loose stools. 30 tablet 0   ibuprofen (ADVIL) 800 MG tablet TAKE 1 TABLET BY MOUTH EVERY 8 HOURS AS NEEDED 30 tablet 1   insulin aspart (NOVOLOG) 100 UNIT/ML injection To use with pump     Insulin Disposable Pump (OMNIPOD DASH 5 PACK PODS) MISC Inject into the skin.     Insulin Pen Needle (PEN NEEDLES) 31G X 5 MM MISC Inject 1 pen into the skin 3 (three) times daily. 300 each 3   metFORMIN (GLUCOPHAGE) 500 MG tablet TAKE 1 TABLET WITH         BREAKFAST, WITH LUNCH, AND WITH EVENING MEAL. 270 tablet 0   ondansetron (ZOFRAN) 4 MG tablet Take 1 tablet (4 mg total) by mouth every 6 (six) hours. (Patient taking differently: Take 4 mg by mouth every 6 (six) hours. As needed) 30 tablet 0   No current facility-administered medications on file prior to visit.    No Known Allergies  Past Medical History:  Diagnosis Date   Allergy     CAP (community acquired pneumonia) 07/27/2017   Closed fracture of base of fifth metatarsal bone of right foot 01/27/2018   Diabetes mellitus without complication (HCC)    Hyperlipidemia    Supraventricular tachycardia (HCC)     Past Surgical History:  Procedure Laterality Date   TONSILECTOMY, ADENOIDECTOMY, BILATERAL MYRINGOTOMY AND TUBES      Family History  Problem Relation Age of Onset   Thyroid disease Mother    Hyperlipidemia Father    Cancer Maternal Grandmother        Breast and lung   Heart disease Maternal Grandmother    Healthy Maternal Grandfather    Healthy Paternal Grandmother    Heart disease Paternal Grandfather    Arthritis Maternal Aunt    Diabetes Maternal Aunt    Hyperlipidemia Maternal Aunt     Social History   Socioeconomic History   Marital status: Married    Spouse name: Not on file   Number of children: 2   Years of education: 18   Highest education level: Not on file  Occupational History   Occupation: Production designer, theatre/television/film   Tobacco Use   Smoking status: Never   Smokeless tobacco: Never  Substance and Sexual Activity   Alcohol use: Yes    Alcohol/week: 1.0 standard drink of alcohol  Types: 1 Glasses of wine per week   Drug use: No   Sexual activity: Yes  Other Topics Concern   Not on file  Social History Narrative   Lives with husband [name]) and 2 children   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    Diet: good water, fruits/vegetables daily   Social Determinants of Health   Financial Resource Strain: Not on file  Food Insecurity: Not on file  Transportation Needs: Not on file  Physical Activity: Not on file  Stress: Not on file  Social Connections: Not on file  Intimate Partner Violence: Not on file   Review of Systems Eating okay Slight nasal drainage--he does have some chronic sinus issues No rash    Objective:   Physical Exam Constitutional:      Appearance: Normal appearance.  HENT:     Head:      Comments: No sinus tenderness    Nose:     Comments: Mild inflammation    Mouth/Throat:     Comments: Very slight pharyngeal injection No tonsils Neck:     Comments: ?borderline enlarged lower anterior cervical nodes--but not tender Pulmonary:     Effort: Pulmonary effort is normal.     Breath sounds: Normal breath sounds. No wheezing or rales.  Musculoskeletal:     Cervical back: Neck supple.  Skin:    Findings: No rash.  Neurological:     Mental Status: He is alert.            Assessment & Plan:

## 2021-10-23 ENCOUNTER — Other Ambulatory Visit: Payer: Self-pay | Admitting: Family Medicine

## 2021-12-01 ENCOUNTER — Ambulatory Visit: Payer: BC Managed Care – PPO | Admitting: Family Medicine

## 2021-12-01 ENCOUNTER — Encounter: Payer: Self-pay | Admitting: Family Medicine

## 2021-12-01 VITALS — BP 120/82 | HR 73 | Temp 97.5°F | Ht 72.0 in | Wt 228.4 lb

## 2021-12-01 DIAGNOSIS — E1169 Type 2 diabetes mellitus with other specified complication: Secondary | ICD-10-CM | POA: Diagnosis not present

## 2021-12-01 DIAGNOSIS — Z794 Long term (current) use of insulin: Secondary | ICD-10-CM | POA: Diagnosis not present

## 2021-12-01 DIAGNOSIS — M79671 Pain in right foot: Secondary | ICD-10-CM | POA: Diagnosis not present

## 2021-12-01 LAB — POCT GLYCOSYLATED HEMOGLOBIN (HGB A1C): Hemoglobin A1C: 6.9 % — AB (ref 4.0–5.6)

## 2021-12-01 NOTE — Assessment & Plan Note (Addendum)
Chronic, stable. Continue current regimen.  On insulin pump - appreciate endo care, pending appt next month.  Later this month will be due for eye exam later this month.  Nausea to GLP1RAs, will discuss mounjaro with endo.

## 2021-12-01 NOTE — Patient Instructions (Addendum)
A1c well controlled today.  Call and schedule eye exam.  Good to see you today.  Return as needed or in 6 months for follow up visit.

## 2021-12-01 NOTE — Assessment & Plan Note (Signed)
Saw sports medicine, placed in bilateral custom orthotics with benefit.

## 2021-12-01 NOTE — Progress Notes (Signed)
Patient ID: Javier Mccoy, male    DOB: 31-Dec-1980, 41 y.o.   MRN: [id number]  This visit was conducted in person.  BP 120/82   Pulse 73   Temp (!) 97.5 F (36.4 C) (Temporal)   Ht 6' (1.829 m)   Wt 228 lb 6 oz (103.6 kg)   SpO2 95%   BMI 30.97 kg/m    CC: DM f/u visit  Subjective:   HPI: Javier Mccoy is a 41 y.o. male presenting on 12/01/2021 for Diabetes (Here for 6 mo f/u.)   Saw Dr. Dallas Schimke earlier in the year, status post custom orthotics for chronic right foot pain - this has helped, walking a couple miles a day.   DM - does regularly check sugars and brings log which was reviewed. Seeing endo 12/2021. Compliant with antihyperglycemic regimen which includes: novolog through insulin pump (omnipod), metformin 500mg  TID with meals. Denies low sugars or hypoglycemic symptoms. Denies paresthesias, blurry vision. Last diabetic eye exam 11/2020. Glucometer brand: freestyle libre 3. #s reviewed - average sugars 170s, 30d range 55% in goal, 35% 181-250, 8% >@50 , 2% <70. Last foot exam: 11/2020. DSME: completed remotely. Lab Results  Component Value Date   HGBA1C 6.9 (A) 12/01/2021   Diabetic Foot Exam - Simple   Simple Foot Form Diabetic Foot exam was performed with the following findings: Yes 12/01/2021  8:28 AM  Visual Inspection No deformities, no ulcerations, no other skin breakdown bilaterally: Yes Sensation Testing Intact to touch and monofilament testing bilaterally: Yes Pulse Check Posterior Tibialis and Dorsalis pulse intact bilaterally: Yes Comments 2+ DP bilaterally    Lab Results  Component Value Date   MICROALBUR 2.0 (H) 05/28/2021         Relevant past medical, surgical, family and social history reviewed and updated as indicated. Interim medical history since our last visit reviewed. Allergies and medications reviewed and updated. Outpatient Medications Prior to Visit  Medication Sig Dispense Refill   amoxicillin (AMOXIL) 500 MG tablet Take 2  tablets (1,000 mg total) by mouth 2 (two) times daily. 40 tablet 0   atenolol (TENORMIN) 50 MG tablet TAKE 1 TABLET DAILY 90 tablet 0   atorvastatin (LIPITOR) 40 MG tablet Take 1 tablet (40 mg total) by mouth daily. 90 tablet 3   cetirizine (ZYRTEC) 10 MG tablet Take 10 mg by mouth daily.     Continuous Blood Gluc Receiver (FREESTYLE LIBRE 14 DAY READER) DEVI by Does not apply route.     Continuous Blood Gluc Sensor (FREESTYLE LIBRE 14 DAY SENSOR) MISC by Does not apply route.     diphenoxylate-atropine (LOMOTIL) 2.5-0.025 MG tablet Take 2 tablets by mouth 4 (four) times daily as needed for diarrhea or loose stools. 30 tablet 0   ibuprofen (ADVIL) 800 MG tablet TAKE 1 TABLET BY MOUTH EVERY 8 HOURS AS NEEDED 30 tablet 1   insulin aspart (NOVOLOG) 100 UNIT/ML injection To use with pump     Insulin Disposable Pump (OMNIPOD DASH 5 PACK PODS) MISC Inject into the skin.     Insulin Pen Needle (PEN NEEDLES) 31G X 5 MM MISC Inject 1 pen into the skin 3 (three) times daily. 300 each 3   metFORMIN (GLUCOPHAGE) 500 MG tablet TAKE 1 TABLET WITH         BREAKFAST, WITH LUNCH, AND WITH EVENING MEAL. 270 tablet 0   ondansetron (ZOFRAN) 4 MG tablet Take 1 tablet (4 mg total) by mouth every 6 (six) hours. (Patient taking differently: Take 4  mg by mouth every 6 (six) hours. As needed) 30 tablet 0   No facility-administered medications prior to visit.     Per HPI unless specifically indicated in ROS section below Review of Systems  Objective:  BP 120/82   Pulse 73   Temp (!) 97.5 F (36.4 C) (Temporal)   Ht 6' (1.829 m)   Wt 228 lb 6 oz (103.6 kg)   SpO2 95%   BMI 30.97 kg/m   Wt Readings from Last 3 Encounters:  12/01/21 228 lb 6 oz (103.6 kg)  10/06/21 228 lb (103.4 kg)  07/02/21 229 lb (103.9 kg)      Physical Exam Vitals and nursing note reviewed.  Constitutional:      Appearance: Normal appearance. He is not ill-appearing.  Eyes:     Extraocular Movements: Extraocular movements intact.      Conjunctiva/sclera: Conjunctivae normal.     Pupils: Pupils are equal, round, and reactive to light.  Cardiovascular:     Rate and Rhythm: Normal rate and regular rhythm.     Pulses: Normal pulses.     Heart sounds: Normal heart sounds. No murmur heard. Pulmonary:     Effort: Pulmonary effort is normal. No respiratory distress.     Breath sounds: Normal breath sounds. No wheezing, rhonchi or rales.  Musculoskeletal:     Right lower leg: No edema.     Left lower leg: No edema.     Comments: See HPI for foot exam if done  Skin:    General: Skin is warm and dry.     Findings: No rash.  Neurological:     Mental Status: He is alert.  Psychiatric:        Mood and Affect: Mood normal.        Behavior: Behavior normal.       Results for orders placed or performed in visit on 12/01/21  POCT glycosylated hemoglobin (Hb A1C)  Result Value Ref Range   Hemoglobin A1C 6.9 (A) 4.0 - 5.6 %   HbA1c POC (<> result, manual entry)     HbA1c, POC (prediabetic range)     HbA1c, POC (controlled diabetic range)      Assessment & Plan:   Problem List Items Addressed This Visit     Type 2 diabetes mellitus with other specified complication (HCC) - Primary    Chronic, stable. Continue current regimen.  On insulin pump - appreciate endo care, pending appt next month.  Later this month will be due for eye exam later this month.  Nausea to GLP1RAs, will discuss mounjaro with endo.       Relevant Orders   POCT glycosylated hemoglobin (Hb A1C) (Completed)   Right foot pain    Saw sports medicine, placed in bilateral custom orthotics with benefit.         No orders of the defined types were placed in this encounter.  Orders Placed This Encounter  Procedures   POCT glycosylated hemoglobin (Hb A1C)     Patient Instructions  A1c well controlled today.  Call and schedule eye exam.  Good to see you today.  Return as needed or in 6 months for follow up visit.   Follow up plan: Return in  about 6 months (around 06/03/2022) for annual exam, prior fasting for blood work.  Eustaquio Boyden, MD

## 2021-12-01 NOTE — Telephone Encounter (Signed)
Opened in error

## 2021-12-14 ENCOUNTER — Ambulatory Visit: Payer: BC Managed Care – PPO | Admitting: Family Medicine

## 2021-12-14 ENCOUNTER — Encounter: Payer: Self-pay | Admitting: Family Medicine

## 2021-12-14 DIAGNOSIS — R21 Rash and other nonspecific skin eruption: Secondary | ICD-10-CM | POA: Insufficient documentation

## 2021-12-14 MED ORDER — TRIAMCINOLONE ACETONIDE 0.1 % EX CREA: 1.0000 | TOPICAL_CREAM | Freq: Two times a day (BID) | CUTANEOUS | 0 refills | Status: AC

## 2021-12-14 NOTE — Patient Instructions (Signed)
Possible poison ivy dermatitis  Treat with topical steroid - triamcinolone twice daily as needed for the next week.  May continue calomine, benadryl if needed. Let us know if not improving with this treatment.

## 2021-12-14 NOTE — Progress Notes (Signed)
Patient ID: Javier Mccoy, male    DOB: [birth year], 41 y.o.   MRN: [id number]  This visit was conducted in person.  BP 116/68   Pulse 76   Temp 97.6 F (36.4 C) (Temporal)   Ht 6' (1.829 m)   Wt 227 lb (103 kg)   SpO2 94%   BMI 30.79 kg/m    CC: rash  Subjective:   HPI: Press Javier Mccoy is a 41 y.o. male presenting on 12/14/2021 for Rash (C/o rash  allover. Started 12/12/21. Areas itch occasionally.)   1+ wk h/o rash throughout body, started to chest under nipples as well as left lateral lower leg, spread onto arms. Initially tender then itchy.  Treating with calomine lotion, rubbing alcohol.   No new meds, supplements, OTC meds, foods.  Recently switched back to gain (instead of tide).  No new lotions, soaps or shampoos.   No similar rash at home.  No recent hotel stay. . Indoor dogs.   No fevers/chills, nausea, abd pain, joint pain or headaches.  He was working out in yard - rash started 12d afterwards.      Relevant past medical, surgical, family and social history reviewed and updated as indicated. Interim medical history since our last visit reviewed. Allergies and medications reviewed and updated. Outpatient Medications Prior to Visit  Medication Sig Dispense Refill   atenolol (TENORMIN) 50 MG tablet TAKE 1 TABLET DAILY 90 tablet 0   atorvastatin (LIPITOR) 40 MG tablet Take 1 tablet (40 mg total) by mouth daily. 90 tablet 3   cetirizine (ZYRTEC) 10 MG tablet Take 10 mg by mouth daily.     Continuous Blood Gluc Receiver (FREESTYLE LIBRE 14 DAY READER) DEVI by Does not apply route.     Continuous Blood Gluc Sensor (FREESTYLE LIBRE 14 DAY SENSOR) MISC by Does not apply route.     diphenoxylate-atropine (LOMOTIL) 2.5-0.025 MG tablet Take 2 tablets by mouth 4 (four) times daily as needed for diarrhea or loose stools. 30 tablet 0   ibuprofen (ADVIL) 800 MG tablet TAKE 1 TABLET BY MOUTH EVERY 8 HOURS AS NEEDED 30 tablet 1   insulin aspart (NOVOLOG) 100 UNIT/ML  injection To use with pump     Insulin Disposable Pump (OMNIPOD DASH 5 PACK PODS) MISC Inject into the skin.     Insulin Pen Needle (PEN NEEDLES) 31G X 5 MM MISC Inject 1 pen into the skin 3 (three) times daily. 300 each 3   metFORMIN (GLUCOPHAGE) 500 MG tablet TAKE 1 TABLET WITH         BREAKFAST, WITH LUNCH, AND WITH EVENING MEAL. 270 tablet 0   ondansetron (ZOFRAN) 4 MG tablet Take 1 tablet (4 mg total) by mouth every 6 (six) hours. (Patient taking differently: Take 4 mg by mouth every 6 (six) hours. As needed) 30 tablet 0   amoxicillin (AMOXIL) 500 MG tablet Take 2 tablets (1,000 mg total) by mouth 2 (two) times daily. 40 tablet 0   No facility-administered medications prior to visit.     Per HPI unless specifically indicated in ROS section below Review of Systems  Objective:  BP 116/68   Pulse 76   Temp 97.6 F (36.4 C) (Temporal)   Ht 6' (1.829 m)   Wt 227 lb (103 kg)   SpO2 94%   BMI 30.79 kg/m   Wt Readings from Last 3 Encounters:  12/14/21 227 lb (103 kg)  12/01/21 228 lb 6 oz (103.6 kg)  10/06/21 228 lb (103.4  kg)      Physical Exam Vitals and nursing note reviewed.  Constitutional:      Appearance: Normal appearance. He is not ill-appearing.  Skin:    General: Skin is warm and dry.     Findings: Rash present. No erythema.     Comments:  Erythematous blanching rash patch with excoriation to left lateral lower leg Multiple tiny clear fluid filled vesicles, several unroofed, to right lower chest below nipple Few microvesicles to R>L arms None between fingers, or at ventral wrists  Mild pruritis  Neurological:     Mental Status: He is alert.  Psychiatric:        Mood and Affect: Mood normal.        Behavior: Behavior normal.       Results for orders placed or performed in visit on 12/01/21  POCT glycosylated hemoglobin (Hb A1C)  Result Value Ref Range   Hemoglobin A1C 6.9 (A) 4.0 - 5.6 %   HbA1c POC (<> result, manual entry)     HbA1c, POC (prediabetic  range)     HbA1c, POC (controlled diabetic range)      Assessment & Plan:   Problem List Items Addressed This Visit     Skin rash    Suspect poison ivy dermatitis given appearance of rash and exposure to working in the yard prior to rash developing. Rx triamcinolone cream topically, calomine lotion, PO benadryl prn.  Avoid oral steroids in diabetic.  Not consistent with chiggers, scabies, shingles, no evidence of bacterial superinfection.  Update if not improving with treatment.         Meds ordered this encounter  Medications   triamcinolone cream (KENALOG) 0.1 %    Sig: Apply 1 Application topically 2 (two) times daily. Apply to AA.    Dispense:  30 g    Refill:  0   No orders of the defined types were placed in this encounter.    Patient Instructions  Possible poison ivy dermatitis  Treat with topical steroid - triamcinolone twice daily as needed for the next week.  May continue calomine, benadryl if needed. Let us know if not improving with this treatment.   Follow up plan: Return if symptoms worsen or fail to improve.  Eustaquio Boyden, MD

## 2021-12-14 NOTE — Assessment & Plan Note (Addendum)
Suspect poison ivy dermatitis given appearance of rash and exposure to working in the yard prior to rash developing. Rx triamcinolone cream topically, calomine lotion, PO benadryl prn.  Avoid oral steroids in diabetic.  Not consistent with chiggers, scabies, shingles, no evidence of bacterial superinfection.  Update if not improving with treatment.

## 2021-12-18 ENCOUNTER — Other Ambulatory Visit: Payer: Self-pay | Admitting: Family Medicine

## 2022-01-22 LAB — HM DIABETES EYE EXAM

## 2022-01-26 ENCOUNTER — Encounter: Payer: Self-pay | Admitting: Family Medicine

## 2022-06-04 ENCOUNTER — Ambulatory Visit (INDEPENDENT_AMBULATORY_CARE_PROVIDER_SITE_OTHER): Payer: BC Managed Care – PPO | Admitting: Family Medicine

## 2022-06-04 ENCOUNTER — Encounter: Payer: Self-pay | Admitting: Family Medicine

## 2022-06-04 VITALS — BP 120/82 | HR 75 | Temp 97.3°F | Ht 71.25 in | Wt 213.4 lb

## 2022-06-04 DIAGNOSIS — E663 Overweight: Secondary | ICD-10-CM | POA: Diagnosis not present

## 2022-06-04 DIAGNOSIS — Z794 Long term (current) use of insulin: Secondary | ICD-10-CM | POA: Diagnosis not present

## 2022-06-04 DIAGNOSIS — Z0000 Encounter for general adult medical examination without abnormal findings: Secondary | ICD-10-CM | POA: Diagnosis not present

## 2022-06-04 DIAGNOSIS — I471 Supraventricular tachycardia, unspecified: Secondary | ICD-10-CM

## 2022-06-04 DIAGNOSIS — E785 Hyperlipidemia, unspecified: Secondary | ICD-10-CM | POA: Diagnosis not present

## 2022-06-04 DIAGNOSIS — R350 Frequency of micturition: Secondary | ICD-10-CM

## 2022-06-04 DIAGNOSIS — E1169 Type 2 diabetes mellitus with other specified complication: Secondary | ICD-10-CM

## 2022-06-04 LAB — POC URINALSYSI DIPSTICK (AUTOMATED)
Bilirubin, UA: NEGATIVE
Blood, UA: NEGATIVE
Glucose, UA: POSITIVE — AB
Ketones, UA: NEGATIVE
Leukocytes, UA: NEGATIVE
Nitrite, UA: NEGATIVE
Protein, UA: NEGATIVE
Spec Grav, UA: 1.015 (ref 1.010–1.025)
Urobilinogen, UA: 0.2 E.U./dL
pH, UA: 5.5 (ref 5.0–8.0)

## 2022-06-04 LAB — COMPREHENSIVE METABOLIC PANEL
ALT: 20 U/L (ref 0–53)
AST: 14 U/L (ref 0–37)
Albumin: 4.4 g/dL (ref 3.5–5.2)
Alkaline Phosphatase: 76 U/L (ref 39–117)
BUN: 20 mg/dL (ref 6–23)
CO2: 28 mEq/L (ref 19–32)
Calcium: 9.3 mg/dL (ref 8.4–10.5)
Chloride: 105 mEq/L (ref 96–112)
Creatinine, Ser: 0.96 mg/dL (ref 0.40–1.50)
GFR: 97.59 mL/min (ref >60.00)
Glucose, Bld: 106 mg/dL — ABNORMAL HIGH (ref 70–99)
Potassium: 4.6 mEq/L (ref 3.5–5.1)
Sodium: 141 mEq/L (ref 135–145)
Total Bilirubin: 0.9 mg/dL (ref 0.2–1.2)
Total Protein: 6.7 g/dL (ref 6.0–8.3)

## 2022-06-04 LAB — LIPID PANEL
Cholesterol: 157 mg/dL (ref 0–200)
HDL: 39.5 mg/dL (ref >39.00)
LDL Cholesterol: 91 mg/dL (ref 0–99)
NonHDL: 117.83
Total CHOL/HDL Ratio: 4
Triglycerides: 136 mg/dL (ref 0.0–149.0)
VLDL: 27.2 mg/dL (ref 0.0–40.0)

## 2022-06-04 LAB — MICROALBUMIN / CREATININE URINE RATIO
Creatinine,U: 114.5 mg/dL
Microalb Creat Ratio: 0.8 mg/g (ref 0.0–30.0)
Microalb, Ur: 0.9 mg/dL (ref 0.0–1.9)

## 2022-06-04 LAB — HEMOGLOBIN A1C: Hgb A1c MFr Bld: 6.3 % (ref 4.6–6.5)

## 2022-06-04 LAB — TSH: TSH: 1.16 u[IU]/mL (ref 0.35–5.50)

## 2022-06-04 MED ORDER — ATENOLOL 50 MG PO TABS: 50.0000 mg | ORAL_TABLET | Freq: Every day | ORAL | 4 refills | Status: AC

## 2022-06-04 MED ORDER — ATORVASTATIN CALCIUM 40 MG PO TABS: 40.0000 mg | ORAL_TABLET | Freq: Every day | ORAL | 4 refills | Status: AC

## 2022-06-04 MED ORDER — METFORMIN HCL 500 MG PO TABS: ORAL_TABLET | ORAL | 4 refills | Status: AC

## 2022-06-04 NOTE — Assessment & Plan Note (Signed)
H/o this, stable on atenolol 23m daily.

## 2022-06-04 NOTE — Progress Notes (Signed)
Patient ID: Javier Mccoy, male    DOB: 1981-02-20, 42 y.o.   MRN: [id number]  This visit was conducted in person.  BP 120/82   Pulse 75   Temp (!) 97.3 F (36.3 C) (Temporal)   Ht 5' 11.25" (1.81 m)   Wt 213 lb 6 oz (96.8 kg)   SpO2 95%   BMI 29.55 kg/m    CC: CPE Subjective:   HPI: Javier Mccoy is a 42 y.o. male presenting on 06/04/2022 for Annual Exam   Upcoming move to [facility] [place] 08/2022 - to start working at [employer].   DM - sees [facility] endocrinology in [place] for Omnipod insulin pump with novolog. Continues metformin as well. continues this as well. Uses freestyle Libre 3 CGM. Recently started jardiance 24m daily. Avoiding GLP1RA - caused nausea.    Preventative: Flu shot yearly - missed this year  [place] x2, booster 03/2020 Pneumovax 04/2016  Td 2014 Seat belt use discussed Sunscreen use discussed. No changing moles on skin.  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 Interim medical history since our last visit reviewed. Allergies and medications reviewed and updated. Outpatient Medications Prior to Visit  Medication Sig Dispense Refill   cetirizine (ZYRTEC) 10 MG tablet Take 10 mg by mouth daily.     Continuous Blood Gluc Receiver (FREESTYLE LIBRE 14 DAY READER) DEVI by Does not apply route.     Continuous Blood Gluc Sensor (FREESTYLE LIBRE 14 DAY SENSOR) MISC by Does not apply route.     diphenoxylate-atropine (LOMOTIL) 2.5-0.025 MG tablet Take 2 tablets by mouth 4 (four) times daily as needed for diarrhea or loose stools. 30 tablet 0   ibuprofen  (ADVIL) 800 MG tablet TAKE 1 TABLET BY MOUTH EVERY 8 HOURS AS NEEDED 30 tablet 1   insulin aspart (NOVOLOG) 100 UNIT/ML injection To use with pump     Insulin Disposable Pump (OMNIPOD DASH 5 PACK PODS) MISC Inject into the skin.     Insulin Pen Needle (PEN NEEDLES) 31G X 5 MM MISC Inject 1 pen into the skin 3 (three) times daily. 300 each 3   ondansetron (ZOFRAN) 4 MG tablet Take 1 tablet (4 mg total) by mouth every 6 (six) hours. (Patient taking differently: Take 4 mg by mouth every 6 (six) hours. As needed) 30 tablet 0   triamcinolone cream (KENALOG) 0.1 % Apply 1 Application topically 2 (two) times daily. Apply to AA. 30 g 0   atenolol (TENORMIN) 50 MG tablet TAKE 1 TABLET DAILY 90 tablet 1   atorvastatin (LIPITOR) 40 MG tablet Take 1 tablet (40 mg total) by mouth daily. 90 tablet 3   metFORMIN (GLUCOPHAGE) 500 MG tablet TAKE 1 TABLET WITH         BREAKFAST, WITH LUNCH, AND WITH EVENING MEAL. 270 tablet 1   JARDIANCE 10 MG TABS tablet Take 10 mg by mouth daily.     No facility-administered medications prior to visit.    Past Medical History:  Diagnosis Date   Allergy    CAP (community acquired pneumonia) 07/27/2017  Closed fracture of base of fifth metatarsal bone of right foot 01/27/2018   Diabetes mellitus type 2 with complications ([place])    Hyperlipidemia    Supraventricular tachycardia     Past Surgical History:  Procedure Laterality Date   TONSILECTOMY, ADENOIDECTOMY, BILATERAL MYRINGOTOMY AND TUBES      Family History  Problem Relation Age of Onset   Thyroid disease Mother    Hyperlipidemia Father    Cancer Maternal Grandmother        Breast and lung   Heart disease Maternal Grandmother    Healthy Maternal Grandfather    Healthy Paternal Grandmother    Heart disease Paternal Grandfather    Arthritis Maternal Aunt    Diabetes Maternal Aunt    Hyperlipidemia Maternal Aunt     Social History   Tobacco Use   Smoking status: Never   Smokeless tobacco: Never  Substance  Use Topics   Alcohol use: Yes    Alcohol/week: 1.0 standard drink of alcohol    Types: 1 Glasses of wine per week   Drug use: No   Per HPI unless specifically indicated in ROS section below Review of Systems  Constitutional:  Positive for fever (COVID 3wks ago). Negative for activity change, appetite change, chills, fatigue and unexpected weight change.  HENT:  Positive for congestion, postnasal drip, rhinorrhea and sore throat. Negative for hearing loss.   Eyes:  Negative for visual disturbance.  Respiratory:  Positive for cough. Negative for chest tightness, shortness of breath and wheezing.   Cardiovascular:  Negative for chest pain, palpitations and leg swelling.  Gastrointestinal:  Negative for abdominal distention, abdominal pain, blood in stool, constipation, diarrhea, nausea and vomiting.  Genitourinary:  Negative for difficulty urinating and hematuria.  Musculoskeletal:  Negative for arthralgias, myalgias and neck pain.  Skin:  Negative for rash.  Neurological:  Negative for dizziness, seizures, syncope and headaches.  Hematological:  Negative for adenopathy. Does not bruise/bleed easily.  Psychiatric/Behavioral:  Negative for dysphoric mood. The patient is not nervous/anxious.     Objective:  BP 120/82   Pulse 75   Temp (!) 97.3 F (36.3 C) (Temporal)   Ht 5' 11.25" (1.81 m)   Wt 213 lb 6 oz (96.8 kg)   SpO2 95%   BMI 29.55 kg/m   Wt Readings from Last 3 Encounters:  06/04/22 213 lb 6 oz (96.8 kg)  12/14/21 227 lb (103 kg)  12/01/21 228 lb 6 oz (103.6 kg)      Physical Exam Vitals and nursing note reviewed.  Constitutional:      General: He is not in acute distress.    Appearance: Normal appearance. He is well-developed. He is not ill-appearing.  HENT:     Head: Normocephalic and atraumatic.     Right Ear: Hearing, tympanic membrane, ear canal and external ear normal.     Left Ear: Hearing, tympanic membrane, ear canal and external ear normal.      Mouth/Throat:     Comments: Wearing mask Eyes:     General: No scleral icterus.    Extraocular Movements: Extraocular movements intact.     Conjunctiva/sclera: Conjunctivae normal.     Pupils: Pupils are equal, round, and reactive to light.  Neck:     Thyroid: No thyroid mass or thyromegaly.  Cardiovascular:     Rate and Rhythm: Normal rate and regular rhythm.     Pulses: Normal pulses.          Radial pulses are 2+ on the right side  and 2+ on the left side.     Heart sounds: Normal heart sounds. No murmur heard. Pulmonary:     Effort: Pulmonary effort is normal. No respiratory distress.     Breath sounds: Normal breath sounds. No wheezing, rhonchi or rales.  Abdominal:     General: Bowel sounds are normal. There is no distension.     Palpations: Abdomen is soft. There is no mass.     Tenderness: There is no abdominal tenderness. There is no guarding or rebound.     Hernia: No hernia is present.  Musculoskeletal:        General: Normal range of motion.     Cervical back: Normal range of motion and neck supple.     Right lower leg: No edema.     Left lower leg: No edema.  Lymphadenopathy:     Cervical: No cervical adenopathy.  Skin:    General: Skin is warm and dry.     Findings: No rash.  Neurological:     General: No focal deficit present.     Mental Status: He is alert and oriented to person, place, and time.  Psychiatric:        Mood and Affect: Mood normal.        Behavior: Behavior normal.        Thought Content: Thought content normal.        Judgment: Judgment normal.       Results for orders placed or performed in visit on 01/26/22  HM DIABETES EYE EXAM  Result Value Ref Range   HM Diabetic Eye Exam No Retinopathy No Retinopathy   Lab Results  Component Value Date   HGBA1C 6.9 (A) 12/01/2021   No results found for: "TSH"   Assessment & Plan:   Problem List Items Addressed This Visit     Health maintenance examination - Primary (Chronic)     Preventative protocols reviewed and updated unless pt declined. Discussed healthy diet and lifestyle.       Type 2 diabetes mellitus with other specified complication (HCC)    Chronic, continue current regimen. Omnipod insulin pump is followed by endocrinologist.  Update labs today.      Relevant Medications   JARDIANCE 10 MG TABS tablet   atorvastatin (LIPITOR) 40 MG tablet   metFORMIN (GLUCOPHAGE) 500 MG tablet   Other Relevant Orders   Hemoglobin A1c   Microalbumin / creatinine urine ratio   SVT (supraventricular tachycardia)    H/o this, stable on atenolol 63m daily.       Relevant Medications   atenolol (TENORMIN) 50 MG tablet   atorvastatin (LIPITOR) 40 MG tablet   Other Relevant Orders   TSH   Hyperlipidemia associated with type 2 diabetes mellitus (HCC)    Chronic, stable on atorvastatin. Update FLP. The 10-year ASCVD risk score (Arnett DK, et al., 2019) is: 1.7%   Values used to calculate the score:     Age: [age]years     Sex: Male     Is Non-Hispanic African American: No     Diabetic: Yes     Tobacco smoker: No     Systolic Blood Pressure: 1123456mmHg     Is BP treated: No     HDL Cholesterol: 43.3 mg/dL     Total Cholesterol: 149 mg/dL       Relevant Medications   JARDIANCE 10 MG TABS tablet   atenolol (TENORMIN) 50 MG tablet   atorvastatin (LIPITOR) 40 MG tablet  metFORMIN (GLUCOPHAGE) 500 MG tablet   Other Relevant Orders   Lipid panel   Comprehensive metabolic panel   TSH   Overweight with body mass index (BMI) 25.0-29.9    Congratulated on 15 lb weight loss over the past 6 months, since starting jardiance.  Encouraged continued healthy diet and lifestyle choices to affect sustainable weight loss.       Other Visit Diagnoses     Urinary frequency       Relevant Orders   POCT Urinalysis Dipstick (Automated)        Meds ordered this encounter  Medications   atenolol (TENORMIN) 50 MG tablet    Sig: Take 1 tablet (50 mg total) by mouth daily.     Dispense:  90 tablet    Refill:  4   atorvastatin (LIPITOR) 40 MG tablet    Sig: Take 1 tablet (40 mg total) by mouth daily.    Dispense:  90 tablet    Refill:  4   metFORMIN (GLUCOPHAGE) 500 MG tablet    Sig: TAKE 1 TABLET with BREAKFAST, WITH LUNCH, AND WITH EVENING MEAL.    Dispense:  270 tablet    Refill:  4    Orders Placed This Encounter  Procedures   Lipid panel   Comprehensive metabolic panel   Hemoglobin A1c   Microalbumin / creatinine urine ratio   TSH   POCT Urinalysis Dipstick (Automated)    Patient Instructions  Meds refilled Labs today including urine tests x2.  Good to see you today.  Return as needed. Good luck with upcoming move to [place]!  Follow up plan: Return if symptoms worsen or fail to improve.  Ria Bush, MD

## 2022-06-04 NOTE — Patient Instructions (Signed)
Meds refilled Labs today including urine tests x2.  Good to see you today.  Return as needed. Good luck with upcoming move to [place]!

## 2022-06-04 NOTE — Assessment & Plan Note (Signed)
Chronic, stable on atorvastatin. Update FLP. The 10-year ASCVD risk score (Arnett DK, et al., 2019) is: 1.7%   Values used to calculate the score:     Age: 42 years     Sex: Male     Is Non-Hispanic African American: No     Diabetic: Yes     Tobacco smoker: No     Systolic Blood Pressure: 123456 mmHg     Is BP treated: No     HDL Cholesterol: 43.3 mg/dL     Total Cholesterol: 149 mg/dL

## 2022-06-04 NOTE — Assessment & Plan Note (Signed)
Congratulated on 15 lb weight loss over the past 6 months, since starting jardiance.  Encouraged continued healthy diet and lifestyle choices to affect sustainable weight loss.

## 2022-06-04 NOTE — Assessment & Plan Note (Signed)
Chronic, continue current regimen. Omnipod insulin pump is followed by endocrinologist.  Update labs today.

## 2022-06-04 NOTE — Assessment & Plan Note (Signed)
Preventative protocols reviewed and updated unless pt declined. Discussed healthy diet and lifestyle.  

## 2022-08-20 ENCOUNTER — Encounter: Payer: Self-pay | Admitting: Family Medicine

## 2022-08-20 ENCOUNTER — Ambulatory Visit: Payer: BC Managed Care – PPO | Admitting: Family Medicine

## 2022-08-20 VITALS — BP 90/60 | HR 81 | Temp 97.9°F | Ht 71.25 in | Wt 217.2 lb

## 2022-08-20 DIAGNOSIS — J0110 Acute frontal sinusitis, unspecified: Secondary | ICD-10-CM | POA: Diagnosis not present

## 2022-08-20 DIAGNOSIS — E1169 Type 2 diabetes mellitus with other specified complication: Secondary | ICD-10-CM | POA: Diagnosis not present

## 2022-08-20 DIAGNOSIS — Z794 Long term (current) use of insulin: Secondary | ICD-10-CM | POA: Diagnosis not present

## 2022-08-20 MED ORDER — AZITHROMYCIN 250 MG PO TABS: ORAL_TABLET | ORAL | 0 refills | Status: AC

## 2022-08-20 MED ORDER — PREDNISONE 10 MG PO TABS: 10.0000 mg | ORAL_TABLET | Freq: Every day | ORAL | 0 refills | Status: AC

## 2022-08-20 NOTE — Assessment & Plan Note (Signed)
Acute, concern for bacterial superinfection on top of initial viral versus allergy etiology given length of time of symptoms. Facial pain going on greater than 10 days.  Will treat with prednisone taper but will use 10 mg tablets given presence of diabetes.  He will follow his blood sugar closely while on.  If he does not have improvement within 48 to 72 hours of starting the prednisone he will go ahead and fill the antibiotics to cover for possible bacterial superinfection.  Return and ER precautions provided.

## 2022-08-20 NOTE — Progress Notes (Signed)
Patient ID: Javier Mccoy, male    DOB: 05/31/1980, 42 y.o.   MRN: [id number]  This visit was conducted in person.  Pulse 81   Temp 97.9 F (36.6 C) (Temporal)   Ht 5' 11.25" (1.81 m)   Wt 217 lb 4 oz (98.5 kg)   SpO2 96%   BMI 30.09 kg/m    CC:  Chief Complaint  Patient presents with   Cough   Facial Pain    X 10 days   Post Nasal Drip    Subjective:   HPI: Javier Mccoy is a 42 y.o. male patient of Dr. Reece Agar with history of  DM2, SVT presenting on 08/20/2022 for Cough, Facial Pain (X 10 days), and Post Nasal Drip    Date of onset:  10 days ago Initial symptoms included  sinus pressure and nasal congestion, PND Symptoms progressed to Cough, productive... clear mucus.  Lingering tickle in throat and PND  Mild left ear pain  Tenderness over fronatal sinuses.  NO SOB, no wheeze.  Some fatigue. No fever   Sick contacts:  none COVID testing:   none, but son who was also sick tested negative for COVID     He has tried to treat with  zyrtec, tessalon perles, mucinex     No history of chronic lung disease such as asthma or COPD. Non-smoker.    FBS higher in last week. Occ up to 200. Lab Results  Component Value Date   HGBA1C 6.3 06/04/2022        Relevant past medical, surgical, family and social history reviewed and updated as indicated. Interim medical history since our last visit reviewed. Allergies and medications reviewed and updated. Outpatient Medications Prior to Visit  Medication Sig Dispense Refill   atenolol (TENORMIN) 50 MG tablet Take 1 tablet (50 mg total) by mouth daily. 90 tablet 4   atorvastatin (LIPITOR) 40 MG tablet Take 1 tablet (40 mg total) by mouth daily. 90 tablet 4   Benzonatate (TESSALON PO) Take 1 Capful by mouth 3 (three) times daily as needed.     cetirizine (ZYRTEC) 10 MG tablet Take 10 mg by mouth daily.     Continuous Blood Gluc Receiver (FREESTYLE LIBRE 14 DAY READER) DEVI by Does not apply route.     Continuous Blood  Gluc Sensor (FREESTYLE LIBRE 14 DAY SENSOR) MISC by Does not apply route.     ibuprofen (ADVIL) 800 MG tablet TAKE 1 TABLET BY MOUTH EVERY 8 HOURS AS NEEDED 30 tablet 1   insulin aspart (NOVOLOG) 100 UNIT/ML injection To use with pump     Insulin Disposable Pump (OMNIPOD DASH 5 PACK PODS) MISC Inject into the skin.     Insulin Pen Needle (PEN NEEDLES) 31G X 5 MM MISC Inject 1 pen into the skin 3 (three) times daily. 300 each 3   JARDIANCE 10 MG TABS tablet Take 10 mg by mouth daily.     metFORMIN (GLUCOPHAGE) 500 MG tablet TAKE 1 TABLET with BREAKFAST, WITH LUNCH, AND WITH EVENING MEAL. 270 tablet 4   ondansetron (ZOFRAN) 4 MG tablet Take 1 tablet (4 mg total) by mouth every 6 (six) hours. (Patient taking differently: Take 4 mg by mouth every 6 (six) hours. As needed) 30 tablet 0   diphenoxylate-atropine (LOMOTIL) 2.5-0.025 MG tablet Take 2 tablets by mouth 4 (four) times daily as needed for diarrhea or loose stools. (Patient not taking: Reported on 08/20/2022) 30 tablet 0   triamcinolone cream (KENALOG) 0.1 %  Apply 1 Application topically 2 (two) times daily. Apply to AA. (Patient not taking: Reported on 08/20/2022) 30 g 0   No facility-administered medications prior to visit.     Per HPI unless specifically indicated in ROS section below Review of Systems  Constitutional:  Positive for fatigue. Negative for fever.  HENT:  Positive for congestion, sinus pressure, sinus pain and sore throat. Negative for ear pain.   Eyes:  Negative for pain.  Respiratory:  Positive for cough. Negative for shortness of breath.   Cardiovascular:  Negative for chest pain, palpitations and leg swelling.  Gastrointestinal:  Negative for abdominal pain.  Genitourinary:  Negative for dysuria.  Musculoskeletal:  Negative for arthralgias.  Neurological:  Negative for syncope, light-headedness and headaches.  Psychiatric/Behavioral:  Negative for dysphoric mood.    Objective:  Pulse 81   Temp 97.9 F (36.6 C)  (Temporal)   Ht 5' 11.25" (1.81 m)   Wt 217 lb 4 oz (98.5 kg)   SpO2 96%   BMI 30.09 kg/m   Wt Readings from Last 3 Encounters:  08/20/22 217 lb 4 oz (98.5 kg)  06/04/22 213 lb 6 oz (96.8 kg)  12/14/21 227 lb (103 kg)      Physical Exam Constitutional:      General: He is not in acute distress.    Appearance: Normal appearance. He is well-developed. He is not ill-appearing or toxic-appearing.  HENT:     Head: Normocephalic and atraumatic.     Right Ear: Hearing, ear canal and external ear normal. No tenderness. A middle ear effusion is present. No foreign body. Tympanic membrane is not retracted or bulging.     Left Ear: Hearing, ear canal and external ear normal. No tenderness. A middle ear effusion is present. No foreign body. Tympanic membrane is not retracted or bulging.     Nose: No mucosal edema or rhinorrhea.     Right Turbinates: Swollen.     Left Turbinates: Swollen.     Right Sinus: Frontal sinus tenderness present. No maxillary sinus tenderness.     Left Sinus: Frontal sinus tenderness present. No maxillary sinus tenderness.     Mouth/Throat:     Dentition: Normal dentition. No dental caries.     Pharynx: Uvula midline. No oropharyngeal exudate.     Tonsils: No tonsillar abscesses.  Eyes:     General: Lids are normal. Lids are everted, no foreign bodies appreciated.     Conjunctiva/sclera: Conjunctivae normal.     Pupils: Pupils are equal, round, and reactive to light.  Neck:     Thyroid: No thyroid mass or thyromegaly.     Vascular: No carotid bruit.     Trachea: Trachea and phonation normal.  Cardiovascular:     Rate and Rhythm: Normal rate and regular rhythm.     Pulses: Normal pulses.     Heart sounds: Normal heart sounds, S1 normal and S2 normal. No murmur heard.    No gallop.  Pulmonary:     Effort: Pulmonary effort is normal. No respiratory distress.     Breath sounds: Normal breath sounds. No wheezing, rhonchi or rales.  Abdominal:     General: Bowel  sounds are normal.     Palpations: Abdomen is soft.     Tenderness: There is no abdominal tenderness. There is no guarding or rebound.     Hernia: No hernia is present.  Musculoskeletal:     Cervical back: Normal range of motion and neck supple.  Skin:  General: Skin is warm and dry.     Findings: No rash.  Neurological:     Mental Status: He is alert.  Psychiatric:        Speech: Speech normal.        Behavior: Behavior normal.        Judgment: Judgment normal.       Results for orders placed or performed in visit on 06/04/22  Lipid panel  Result Value Ref Range   Cholesterol 157 0 - 200 mg/dL   Triglycerides 161.0 0.0 - 149.0 mg/dL   HDL 96.04 >54.09 mg/dL   VLDL 81.1 0.0 - 91.4 mg/dL   LDL Cholesterol 91 0 - 99 mg/dL   Total CHOL/HDL Ratio 4    NonHDL 117.83   Comprehensive metabolic panel  Result Value Ref Range   Sodium 141 135 - 145 mEq/L   Potassium 4.6 3.5 - 5.1 mEq/L   Chloride 105 96 - 112 mEq/L   CO2 28 19 - 32 mEq/L   Glucose, Bld 106 (H) 70 - 99 mg/dL   BUN 20 6 - 23 mg/dL   Creatinine, Ser 7.82 0.40 - 1.50 mg/dL   Total Bilirubin 0.9 0.2 - 1.2 mg/dL   Alkaline Phosphatase 76 39 - 117 U/L   AST 14 0 - 37 U/L   ALT 20 0 - 53 U/L   Total Protein 6.7 6.0 - 8.3 g/dL   Albumin 4.4 3.5 - 5.2 g/dL   GFR 95.62 >13.08 mL/min   Calcium 9.3 8.4 - 10.5 mg/dL  Hemoglobin M5H  Result Value Ref Range   Hgb A1c MFr Bld 6.3 4.6 - 6.5 %  Microalbumin / creatinine urine ratio  Result Value Ref Range   Microalb, Ur 0.9 0.0 - 1.9 mg/dL   Creatinine,U 846.9 mg/dL   Microalb Creat Ratio 0.8 0.0 - 30.0 mg/g  TSH  Result Value Ref Range   TSH 1.16 0.35 - 5.50 uIU/mL  POCT Urinalysis Dipstick (Automated)  Result Value Ref Range   Color, UA yellow    Clarity, UA clear    Glucose, UA Positive (A) Negative   Bilirubin, UA negative    Ketones, UA negative    Spec Grav, UA 1.015 1.010 - 1.025   Blood, UA negative    pH, UA 5.5 5.0 - 8.0   Protein, UA Negative  Negative   Urobilinogen, UA 0.2 0.2 or 1.0 E.U./dL   Nitrite, UA negative    Leukocytes, UA Negative Negative    Assessment and Plan  There are no diagnoses linked to this encounter.  No follow-ups on file.   Kerby Nora, MD

## 2022-08-20 NOTE — Assessment & Plan Note (Signed)
Chronic, previously well-controlled.  Follow while on prednisone and during infection.

## 2022-11-01 ENCOUNTER — Encounter: Payer: Self-pay | Admitting: Family Medicine
